# Patient Record
Sex: Female | Born: 1960 | Race: Black or African American | Hispanic: No | Smoking: Former smoker
Health system: Southern US, Community
[De-identification: ages and names within clinical notes are randomized; demographics above are authoritative.]

## PROBLEM LIST (undated history)

## (undated) DIAGNOSIS — M7712 Lateral epicondylitis, left elbow: Secondary | ICD-10-CM

## (undated) DIAGNOSIS — G4730 Sleep apnea, unspecified: Secondary | ICD-10-CM

## (undated) DIAGNOSIS — R011 Cardiac murmur, unspecified: Secondary | ICD-10-CM

## (undated) DIAGNOSIS — I10 Essential (primary) hypertension: Secondary | ICD-10-CM

## (undated) DIAGNOSIS — D649 Anemia, unspecified: Secondary | ICD-10-CM

## (undated) DIAGNOSIS — M797 Fibromyalgia: Secondary | ICD-10-CM

## (undated) DIAGNOSIS — M7711 Lateral epicondylitis, right elbow: Secondary | ICD-10-CM

## (undated) DIAGNOSIS — G5602 Carpal tunnel syndrome, left upper limb: Secondary | ICD-10-CM

## (undated) DIAGNOSIS — E049 Nontoxic goiter, unspecified: Secondary | ICD-10-CM

## (undated) HISTORY — PX: LAPAROSCOPY: SHX197

## (undated) HISTORY — PX: BREAST CYST ASPIRATION: SHX578

## (undated) HISTORY — PX: CARPAL TUNNEL RELEASE: SHX101

---

## 2005-01-26 ENCOUNTER — Ambulatory Visit: Payer: Self-pay | Admitting: Unknown Physician Specialty

## 2005-06-20 ENCOUNTER — Ambulatory Visit: Payer: Self-pay | Admitting: Internal Medicine

## 2005-06-29 ENCOUNTER — Ambulatory Visit: Payer: Self-pay | Admitting: Gastroenterology

## 2005-07-04 ENCOUNTER — Ambulatory Visit: Payer: Self-pay | Admitting: Internal Medicine

## 2005-08-04 ENCOUNTER — Ambulatory Visit: Payer: Self-pay | Admitting: Internal Medicine

## 2005-09-01 ENCOUNTER — Ambulatory Visit: Payer: Self-pay | Admitting: Internal Medicine

## 2005-10-02 ENCOUNTER — Ambulatory Visit: Payer: Self-pay | Admitting: Internal Medicine

## 2005-11-01 ENCOUNTER — Ambulatory Visit: Payer: Self-pay | Admitting: Internal Medicine

## 2005-12-02 ENCOUNTER — Ambulatory Visit: Payer: Self-pay | Admitting: Internal Medicine

## 2006-01-01 ENCOUNTER — Ambulatory Visit: Payer: Self-pay | Admitting: Internal Medicine

## 2006-02-01 ENCOUNTER — Ambulatory Visit: Payer: Self-pay | Admitting: Internal Medicine

## 2006-03-04 ENCOUNTER — Ambulatory Visit: Payer: Self-pay | Admitting: Internal Medicine

## 2006-07-04 HISTORY — PX: GASTRIC BYPASS: SHX52

## 2006-07-04 HISTORY — PX: CHOLECYSTECTOMY: SHX55

## 2006-12-12 ENCOUNTER — Ambulatory Visit: Payer: Self-pay | Admitting: Internal Medicine

## 2006-12-13 ENCOUNTER — Ambulatory Visit: Payer: Self-pay | Admitting: Gastroenterology

## 2008-09-25 ENCOUNTER — Encounter: Payer: Self-pay | Admitting: Maternal and Fetal Medicine

## 2008-10-16 ENCOUNTER — Encounter: Payer: Self-pay | Admitting: Maternal & Fetal Medicine

## 2009-02-19 ENCOUNTER — Observation Stay: Payer: Self-pay | Admitting: Obstetrics and Gynecology

## 2009-03-17 ENCOUNTER — Inpatient Hospital Stay: Payer: Self-pay | Admitting: Obstetrics & Gynecology

## 2009-10-12 ENCOUNTER — Encounter: Payer: Self-pay | Admitting: Rheumatology

## 2009-11-01 ENCOUNTER — Encounter: Payer: Self-pay | Admitting: Rheumatology

## 2010-09-07 ENCOUNTER — Ambulatory Visit: Payer: Self-pay | Admitting: Emergency Medicine

## 2011-09-07 ENCOUNTER — Ambulatory Visit: Payer: Self-pay | Admitting: Internal Medicine

## 2012-09-21 ENCOUNTER — Ambulatory Visit: Payer: Self-pay | Admitting: Obstetrics and Gynecology

## 2013-11-28 ENCOUNTER — Ambulatory Visit: Payer: Self-pay | Admitting: Internal Medicine

## 2014-12-17 ENCOUNTER — Other Ambulatory Visit: Payer: Self-pay | Admitting: Internal Medicine

## 2014-12-17 DIAGNOSIS — Z1231 Encounter for screening mammogram for malignant neoplasm of breast: Secondary | ICD-10-CM

## 2015-01-01 ENCOUNTER — Ambulatory Visit
Admission: RE | Admit: 2015-01-01 | Discharge: 2015-01-01 | Disposition: A | Discharge status: Home / Self Care | Payer: 59 | Source: Ambulatory Visit | Attending: Internal Medicine | Admitting: Internal Medicine

## 2015-01-01 DIAGNOSIS — Z1231 Encounter for screening mammogram for malignant neoplasm of breast: Secondary | ICD-10-CM | POA: Diagnosis present

## 2015-03-19 DIAGNOSIS — M7710 Lateral epicondylitis, unspecified elbow: Secondary | ICD-10-CM | POA: Insufficient documentation

## 2015-03-24 ENCOUNTER — Ambulatory Visit: Payer: Worker's Compensation | Attending: Surgery | Admitting: Occupational Therapy

## 2015-03-24 DIAGNOSIS — M79631 Pain in right forearm: Secondary | ICD-10-CM | POA: Diagnosis present

## 2015-03-24 DIAGNOSIS — M6281 Muscle weakness (generalized): Secondary | ICD-10-CM

## 2015-03-24 DIAGNOSIS — M25621 Stiffness of right elbow, not elsewhere classified: Secondary | ICD-10-CM

## 2015-03-24 NOTE — Therapy (Signed)
[facility]INE [address] [place], [address] Phone: [phone]   Fax:  [phone]  Occupational Therapy Treatment  Patient Details  Name: Angelica Wyatt MRN: [id number] Date of Birth: 10-23-1960 Referring Provider:  Christena Flake, MD  Encounter Date: 03/24/2015      OT End of Session - 03/24/15 2127    Visit Number 1   Number of Visits 8   Date for OT Re-Evaluation 04/21/15   OT Start Time 1026   OT Stop Time 1121   OT Time Calculation (min) 55 min   Activity Tolerance Patient tolerated treatment well;Patient limited by pain   Behavior During Therapy [facility] for tasks assessed/performed      No past medical history on file.  Past Surgical History  Procedure Laterality Date  . Breast cyst aspiration Left     neg    There were no vitals filed for this visit.  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 Yes   Pain Score 5    Pain Location Elbow   Pain Orientation Right   Pain Descriptors / Indicators Aching   Pain Onset 1 to 4 weeks ago   Pain Frequency Constant            [facility] OT Assessment - 03/24/15 0001    Assessment   Diagnosis R lateral epicondylitis    Onset Date 02/24/15   Assessment Pt present with pain at R elbow about 4 wks ago - she did get married end of July and moved - so packed some and do more at school with diaper changes and liffting - pt seen  about week ago by Dr Joice Lofts and had a shot - and put in sling - she did not get counter force brace yet - refer to hand therapy/OT    Home  Environment   Lives With Family   Prior Function   Level  of Independence Independent   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 - need to change diaper , lifting - playing     AROM   Right Elbow Flexion 140   Right Elbow Extension -25   Left Elbow Flexion 145   Left Elbow Extension 0   Right Wrist Extension 50 Degrees   Right Wrist Flexion 80 Degrees   Left Wrist Extension 66 Degrees   Left Wrist Flexion 82 Degrees   Strength   Right Hand Grip (lbs) 26   Left Hand Grip (lbs) 45                  OT Treatments/Exercises (OP) - 03/24/15 0001    Elbow Exercises   Other elbow exercises AROM pain free range for flexion and extention    Other elbow exercises Cervical neck latera l flexion to R and schalens stretch to the R after warm shower - slling pulling on neck some what - see HEP                 OT Education -  03/24/15 1431    Education provided Yes   Education Details HEP   Person(s) Educated Patient   Methods Explanation;Demonstration;Tactile cues;Verbal cues;Handout   Comprehension Verbal cues required;Returned demonstration;Verbalized understanding          OT Short Term Goals - 03/24/15 2138    OT SHORT TERM GOAL #1   Title Pain on PREE decrease by at least 15 points   Baseline pain on PREE 36/50   Time 3   Period Weeks   Status New   OT SHORT TERM GOAL #2   Title Pt to verbalize 3 modifications on adapting the way she do her job, tasks at home and ADL's/IADL's   Baseline no knowledge how to adapt   Time 2   Period Weeks   Status New           OT Long Term Goals - 03/24/15 2140    OT LONG TERM GOAL #1   Title AROM at elbow and wrist improve to WNL to use R UE in all ADL's pain free   Baseline Pain 7/10 at worse, no using R hand - mostly L hand    Time 4   Period Weeks   Status New   OT LONG TERM GOAL #2   Title Funcion on PREE improve with at least 15 points   Baseline Function score on PREE 36/50   Time 4   Period Weeks   Status New               Plan -  03/24/15 2129    Clinical Impression Statement Pt present with pain at R elbow and over distal bicep - reporting started about 4 wks ago - she is doing more pick up and diaper changes the last 4-6 wks and she got married 6-7 wks ago and moved - she present with R arm in sling - and some stiffness starting in elbow ROM , decrease wrist extention with pain  in extention , decrease grip strength - and also pain and  R upper trap/scalens in spasm - pt was fitted with wrist splint -  and reinforce to get counter force  forearm splint - she was ace bandaging  over extensor mass - pt can benefit from OT services to decrease pain and  increaes ROM /strength   Pt will benefit from skilled therapeutic intervention in order to improve on the following deficits (Retired) Impaired UE functional use;Increased muscle spasms;Decreased strength;Decreased range of motion;Impaired flexibility;Pain   Rehab Potential Good   OT Frequency 2x / week   OT Duration 4 weeks   OT Treatment/Interventions Self-care/ADL training;Moist Heat;Cryotherapy;Therapeutic exercise;Manual Therapy;Splinting;Patient/family education;Other (comment)   Plan assess HEP , Pain and if got counterforce splint    OT Home Exercise Plan see pt instruction   Consulted and Agree with Plan of Care Patient        Problem List There are no active problems to display for this patient.   Oletta Cohn OTR/L,CLT 03/24/2015, 9:51 PM  [place] [facility] PHYSICAL AND SPORTS MEDICINE [address], [place], [address] Phone: [phone]   Fax:  [phone]

## 2015-03-24 NOTE — Patient Instructions (Signed)
Pt to do heat, cross friction massage lat epicondyle , elbow flexion and extention  AROM  Ice at end   Also do some lateral cervical stretch for upper trap - scalens too - tight from wearing sling   Fitted with wrist extention splint to wear all the time - off with HEP - and maybe when sitting not wear sling and decrease 1-2 hrs day out of sling

## 2015-03-26 ENCOUNTER — Ambulatory Visit: Payer: Worker's Compensation | Admitting: Occupational Therapy

## 2015-03-26 DIAGNOSIS — M25621 Stiffness of right elbow, not elsewhere classified: Secondary | ICD-10-CM

## 2015-03-26 DIAGNOSIS — M79631 Pain in right forearm: Secondary | ICD-10-CM

## 2015-03-26 DIAGNOSIS — M6281 Muscle weakness (generalized): Secondary | ICD-10-CM

## 2015-03-26 NOTE — Therapy (Signed)
[place] [facility]TS [facility]INE [address], [place], [address] Phone: [phone]   Fax:  [phone]  Occupational Therapy Treatment  Patient Details  Name: STEFANIE HODGENS MRN: [id number] Date of Birth: 1960-10-22 Referring Provider:  Christena Flake, MD  Encounter Date: 03/26/2015      OT End of Session - 03/26/15 1502    Visit Number 2   Number of Visits 8   Date for OT Re-Evaluation 04/21/15   OT Start Time 0921   OT Stop Time 1015   OT Time Calculation (min) 54 min   Activity Tolerance Patient tolerated treatment well;Patient limited by pain   Behavior During Therapy [facility] for tasks assessed/performed      No past medical history on file.  Past Surgical History  Procedure Laterality Date  . Breast cyst aspiration Left     neg    There were no vitals filed for this visit.  Visit Diagnosis:  Pain of right forearm  Stiffness of elbow joint, right  Muscle weakness      Subjective Assessment - 03/26/15 1456    Subjective  Pain is little better than last time -  I did not wear the sling and I did buy strap for my forearm but you need to show me how to put it on    Patient Stated Goals Wants to be pain free and the use of my R arm back    Currently in Pain? Yes   Pain Score 4    Pain Location Elbow   Pain Orientation Right   Pain Descriptors / Indicators Aching   Pain Onset 1 to 4 weeks ago        Moist heat to elbow and then on distal upper arm - 10 min - and on R upper traps at the same time  For trigger points - spasm from wearing sling   Gentle PROM flexion of wrist in pronation and neutral - no pain  AROM of elbow flexion and extnetion on wall - focus on end range extention - guarding still from carrying elbow in flexion the last few wks  Needed min A and t/c for end range   Ed on  Donning and wearing of counter force strap on forearm - after donning - pt report decrease pain with grip compare to  prior to donning  Cont wearing wrist extnetion splint   [place] at 3.87mhz, 20% and 0.8 intensity  - 5 in over upper trap R tirgger point static prior to manual  Soft tissue mobs on trigger points prior to cervical  Stretch   Lat flexion and Scalenus stretch for cervical - to decrease pain upper trap on R  Reviewed adn needed min A                         OT Short Term Goals - 03/24/15 2138    OT SHORT TERM GOAL #1   Title Pain on PREE decrease by at least 15 points   Baseline pain on PREE 36/50   Time 3   Period Weeks   Status New   OT SHORT TERM GOAL #2   Title Pt to verbalize 3 modifications on adapting the way she do her job, tasks at home and ADL's/IADL's   Baseline no knowledge how to adapt   Time 2   Period Weeks   Status New  OT Long Term Goals - 03/24/15 2140    OT LONG TERM GOAL #1   Title AROM at elbow and wrist improve to WNL to use R UE in all ADL's pain free   Baseline Pain 7/10 at worse, no using R hand - mostly L hand    Time 4   Period Weeks   Status New   OT LONG TERM GOAL #2   Title Funcion on PREE improve with at least 15 points   Baseline Function score on PREE 36/50   Time 4   Period Weeks   Status New               Plan - 03/26/15 1503    Clinical Impression Statement Pain decreaes this date but still increased at elbow , extensor mass and bicep - pt fitted with counterforce strap and wrsit splint to cont with - no lifting with palm down , squeezing - cont with HEP add  - elbow AROM against wall wit hfoucs on elbow extnetion - cont wit decreaseing pain     Pt will benefit from skilled therapeutic intervention in order to improve on the following deficits (Retired) Impaired UE functional use;Increased muscle spasms;Decreased strength;Decreased range of motion;Impaired flexibility;Pain   Rehab Potential Good   OT Frequency 2x / week   OT Duration 4 weeks   OT Treatment/Interventions Self-care/ADL training;Moist  Heat;Cryotherapy;Therapeutic exercise;Manual Therapy;Splinting;Patient/family education;Other (comment)   Plan assess pain, HEP and if can add wrist flexoin    OT Home Exercise Plan see pt instruction   Consulted and Agree with Plan of Care Patient        Problem List There are no active problems to display for this patient.   Oletta Cohn  OTR/L,CLT  03/26/2015, 3:07 PM  [place] [facility] PHYSICAL AND SPORTS MEDICINE [address], [place], [address] Phone: [phone]   Fax:  [phone]

## 2015-03-26 NOTE — Patient Instructions (Addendum)
Same HEP , add elbow against wall and focus on end range elbow extnetion   Counter force brace when using arm

## 2015-03-31 ENCOUNTER — Ambulatory Visit: Payer: Worker's Compensation | Admitting: Occupational Therapy

## 2015-03-31 DIAGNOSIS — M79631 Pain in right forearm: Secondary | ICD-10-CM

## 2015-03-31 DIAGNOSIS — M25621 Stiffness of right elbow, not elsewhere classified: Secondary | ICD-10-CM

## 2015-03-31 DIAGNOSIS — M6281 Muscle weakness (generalized): Secondary | ICD-10-CM

## 2015-03-31 NOTE — Patient Instructions (Addendum)
Cont with moist heat  Forearm and distall upper arm - as well as L upper traps 10 min at [facility] to decrease pain and increase flexibility  Forearm extensor stretch with elbow to side - 1 0reps hold 5 sec - in neutral and pronation  Extended arm and same - but needed support from other hand under arm   Shoulder ABD and flexion 10 reps  Ext rotation 10 reps - v/c no wrist extention at end rage   Sup/pro with elbow to side  RD and UD in neutal position and elbow to side  10 reps   Ice at end over lateral epicondyle   Reviewed again wearing of counter force brace and wrist splint - but off when at rest or light act like reading or eating  On with using R hand

## 2015-03-31 NOTE — Therapy (Signed)
[facility] PHYS[facility]ORTS MEDICINE [address], [place], [address] Phone: [phone]   Fax:  [phone]  Occupational Therapy Treatment  Patient Details  Name: Angelica Wyatt MRN: [id number] Date of Birth: 20-Jul-1960 Referring Provider:  Christena Flake, MD  Encounter Date: 03/31/2015      OT End of Session - 03/31/15 1018    Visit Number 3   Number of Visits 8   Date for OT Re-Evaluation 04/21/15   OT Start Time 0905   OT Stop Time 1001   OT Time Calculation (min) 56 min   Activity Tolerance Patient tolerated treatment well;Patient limited by pain   Behavior During Therapy [facility] for tasks assessed/performed      No past medical history on file.  Past Surgical History  Procedure Laterality Date  . Breast cyst aspiration Left     neg    There were no vitals filed for this visit.  Visit Diagnosis:  Pain of right forearm  Stiffness of elbow joint, right  Muscle weakness      Subjective Assessment - 03/31/15 1010    Subjective  Pain at my elbow and bicep better - my forearm under strap just feels like sore, more with the rainy weather - like a bruise - but the pain is better with the strap on when using then without it    Patient Stated Goals Wants to be pain free and the use of my R arm back    Currently in Pain? Yes   Pain Score 4    Pain Location Arm   Pain Orientation Right      Moist heating pad to R forearm and distal upper arm - as well as L upper traps 10 min at [facility] to decrease pain and increase flexibility  Forearm extensor stretch with elbow to side - 1 0reps hold 5 sec - in neutral and pronation  Extended arm and same - but needed support from other hand under arm   Shoulder ABD and flexion 10 reps  Needed t/c and mod v/c for compensation of scapula and shoulder elevation - some discomfort again at R upper traps  Ext rotation 10 reps - v/c no wrist extention at end rage   Sup/pro with elbow to side   RD and UD in neutal position and elbow to side  10 reps  DID some soft tissue mobs to extensor mass after heat prior to ROM    Ice at end over lateral epicondyle  Pt report she feels better than coming in - still some discomfort over extensor mass  Cont with counter force brace and wrist splint - but off when at rest or light act like reading or eating  On with using R hand                            OT Education - 03/31/15 1018    Education provided Yes   Education Details HEP   Person(s) Educated Patient   Methods Explanation;Demonstration;Tactile cues;Verbal cues;Handout   Comprehension Verbal cues required;Returned demonstration;Verbalized understanding          OT Short Term Goals - 03/31/15 1021    OT SHORT TERM GOAL #1   Title Pain on PREE decrease by at least 15 points   Baseline pain on PREE 36/50   Time 3   Period Weeks   Status On-going   OT SHORT TERM GOAL #2  Title Pt to verbalize 3 modifications on adapting the way she do her job, tasks at home and ADL's/IADL's   Baseline verbalize some and using palm up as assist   Time 2   Period Weeks   Status On-going           OT Long Term Goals - 03/31/15 1021    OT LONG TERM GOAL #1   Title AROM at elbow and wrist improve to WNL to use R UE in all ADL's pain free   Baseline Pain 7/10 at worse, no using R hand - mostly L hand    Time 4   Period Weeks   Status On-going   OT LONG TERM GOAL #2   Title Funcion on PREE improve with at least 15 points   Baseline Function score on PREE 36/50   Time 4   Period Weeks   Status On-going               Plan - 03/31/15 1019    Clinical Impression Statement Pain cont to improve - and this date not at lateral epicondyle - able to tolerate some stretches to forearm and shoulder , wrist AROM exercises - pt do compensate with elevation of shoulders with shoulder ABD and flexion - do have trouble with some  upper traps spasm - cont to decrease  pain and increase strenght, ROM     Pt will benefit from skilled therapeutic intervention in order to improve on the following deficits (Retired) Impaired UE functional use;Increased muscle spasms;Decreased strength;Decreased range of motion;Impaired flexibility;Pain   Rehab Potential Good   OT Frequency 2x / week   OT Duration 4 weeks   OT Treatment/Interventions Self-care/ADL training;Moist Heat;Cryotherapy;Therapeutic exercise;Manual Therapy;Splinting;Patient/family education;Other (comment)   Plan Assess pain , HEP and upgrade to tolerance   OT Home Exercise Plan see pt instruction   Consulted and Agree with Plan of Care Patient        Problem List There are no active problems to display for this patient.   Oletta Cohn OTR/L,CLT 03/31/2015, 10:23 AM  [place] [facility] PHYSICAL AND SPORTS MEDICINE [address], [place], [address] Phone: [phone]   Fax:  [phone]

## 2015-04-02 ENCOUNTER — Ambulatory Visit: Payer: Worker's Compensation | Admitting: Occupational Therapy

## 2015-04-02 DIAGNOSIS — M79631 Pain in right forearm: Secondary | ICD-10-CM

## 2015-04-02 DIAGNOSIS — M25621 Stiffness of right elbow, not elsewhere classified: Secondary | ICD-10-CM

## 2015-04-02 DIAGNOSIS — M6281 Muscle weakness (generalized): Secondary | ICD-10-CM

## 2015-04-02 NOTE — Patient Instructions (Addendum)
COntrast 3 min heat/ 1 min ice  X 2 and 3 min heat to do at home for pain   Same HEP for stretches and AROM    Cont with counter force brace and wrist splint - but off when at rest or light act like reading or eating  On with using R hand

## 2015-04-02 NOTE — Therapy (Signed)
[place] [facility] PHYSICAL AND SPORTS MEDICINE [address], [place], [address] Phone: [phone]   Fax:  [phone]  Occupational Therapy Treatment  Patient Details  Name: Angelica Wyatt MRN: [id number] Date of Birth: 03/07/1961 Referring Provider:  Christena Flake, MD  Encounter Date: 04/02/2015      OT End of Session - 04/02/15 0955    Visit Number 4   Number of Visits 8   Date for OT Re-Evaluation 04/21/15   OT Start Time 0909   OT Stop Time 0954   OT Time Calculation (min) 45 min   Activity Tolerance Patient tolerated treatment well   Behavior During Therapy [facility] for tasks assessed/performed      No past medical history on file.  Past Surgical History  Procedure Laterality Date  . Breast cyst aspiration Left     neg    There were no vitals filed for this visit.  Visit Diagnosis:  Pain of right forearm  Stiffness of elbow joint, right  Muscle weakness      Subjective Assessment - 04/02/15 0912    Subjective  My arm just ache at night time and when it rains or cold weather - I am awake since 3 am - feels still swollen in the forearm and pain in the forearm -    Patient Stated Goals Wants to be pain free and the use of my R arm back    Pain Score 6    Pain Location Arm   Pain Descriptors / Indicators Aching   Pain Onset More than a month ago   Pain Frequency Constant                              OT Education - 04/02/15 0955    Education provided Yes   Education Details HEP   Person(s) Educated Patient   Methods Explanation;Tactile cues;Demonstration;Verbal cues   Comprehension Verbal cues required;Returned demonstration;Verbalized understanding          OT Short Term Goals - 04/02/15 0958    OT SHORT TERM GOAL #1   Title Pain on PREE decrease by at least 15 points   Baseline pain on PREE 36/50 at eval    Time 3   Period Weeks   Status On-going   OT SHORT TERM GOAL #2   Title Pt to  verbalize 3 modifications on adapting the way she do her job, tasks at home and ADL's/IADL's   Baseline verbalize some and using palm up as assist   Time 2   Period Weeks   Status On-going           OT Long Term Goals - 04/02/15 6962    OT LONG TERM GOAL #1   Title AROM at elbow and wrist improve to WNL to use R UE in all ADL's pain free   Baseline Pain 7/10 at worse, no using R hand - mostly L hand    Time 4   Period Weeks   Status On-going   OT LONG TERM GOAL #2   Title Funcion on PREE improve with at least 15 points   Baseline Function score on PREE 36/50   Time 4   Period Weeks   Status On-going               Plan - 04/02/15 0955    Clinical Impression Statement Pt report achiness over extensor mass with the damp weather,  or when close to coldness in store, no pain or tenderness at lateral epicondyle - able to do HEP, AROM to shoulder , elbow with no increase pain - pt report  that she is approve from workers comp but they are checking if DR Poggi is in system and will let me know if she is continueing therapy here next week    Pt will benefit from skilled therapeutic intervention in order to improve on the following deficits (Retired) Impaired UE functional use;Increased muscle spasms;Decreased strength;Decreased range of motion;Impaired flexibility;Pain   Rehab Potential Good   OT Frequency 2x / week   OT Duration 4 weeks   OT Treatment/Interventions Self-care/ADL training;Moist Heat;Cryotherapy;Therapeutic exercise;Manual Therapy;Splinting;Patient/family education;Other (comment)   Plan asess pain , performance of HEP    OT Home Exercise Plan see pt instruction   Consulted and Agree with Plan of Care Patient        Problem List There are no active problems to display for this patient.   Oletta Cohn OTR/L,CLT 04/02/2015, 10:04 AM  [place] [facility] PHYSICAL AND SPORTS MEDICINE [address], [place],  [address] Phone: [phone]   Fax:  [phone]

## 2015-04-07 ENCOUNTER — Ambulatory Visit: Payer: Worker's Compensation | Admitting: Occupational Therapy

## 2015-04-09 ENCOUNTER — Ambulatory Visit: Payer: Worker's Compensation | Admitting: Occupational Therapy

## 2015-07-08 DIAGNOSIS — G5602 Carpal tunnel syndrome, left upper limb: Secondary | ICD-10-CM

## 2015-07-08 HISTORY — DX: Carpal tunnel syndrome, left upper limb: G56.02

## 2015-11-19 ENCOUNTER — Other Ambulatory Visit: Payer: Self-pay | Admitting: Internal Medicine

## 2015-11-19 DIAGNOSIS — Z1231 Encounter for screening mammogram for malignant neoplasm of breast: Secondary | ICD-10-CM

## 2016-01-07 ENCOUNTER — Ambulatory Visit: Payer: 59

## 2016-01-11 ENCOUNTER — Ambulatory Visit: Payer: 59

## 2016-01-11 ENCOUNTER — Ambulatory Visit
Admission: RE | Admit: 2016-01-11 | Discharge: 2016-01-11 | Disposition: A | Discharge status: Home / Self Care | Payer: 59 | Source: Ambulatory Visit | Attending: Internal Medicine | Admitting: Internal Medicine

## 2016-01-11 DIAGNOSIS — Z1231 Encounter for screening mammogram for malignant neoplasm of breast: Secondary | ICD-10-CM | POA: Diagnosis present

## 2016-03-17 ENCOUNTER — Encounter: Payer: Self-pay | Admitting: *Deleted

## 2016-03-21 NOTE — Discharge Instructions (Signed)
[place] [facility] °[facility] ° °POST OPERATIVE INSTRUCTIONS FOR DR. TROXLER AND DR. FOWLER °[facility] ° ° °1. Take your medication as prescribed.  Pain medication should be taken only as needed. ° °2. Keep the dressing clean, dry and intact. ° °3. Keep your foot elevated above the heart level for the first 48 hours. ° °4. Walking to the bathroom and brief periods of walking are acceptable, unless we have instructed you to be non-weight bearing. ° °5. Always wear your post-op shoe when walking.  Always use your crutches if you are to be non-weight bearing. ° °6. Do not take a shower. Baths are permissible as long as the foot is kept out of the water.  ° °7. Every hour you are awake:  °- Bend your knee 15 times. °- Flex foot 15 times °- Massage calf 15 times ° °8. Call [facility] ([phone]) if any of the following problems occur: °- You develop a temperature or fever. °- The bandage becomes saturated with blood. °- Medication does not stop your pain. °- Injury of the foot occurs. °- Any symptoms of infection including redness, odor, or red streaks running from wound. ° °General Anesthesia, Adult, Care After °Refer to this sheet in the next few weeks. These instructions provide you with information on caring for yourself after your procedure. Your health care provider may also give you more specific instructions. Your treatment has been planned according to current medical practices, but problems sometimes occur. Call your health care provider if you have any problems or questions after your procedure. °WHAT TO EXPECT AFTER THE PROCEDURE °After the procedure, it is typical to experience: °· Sleepiness. °· Nausea and vomiting. °HOME CARE INSTRUCTIONS °· For the first 24 hours after general anesthesia: °¨ Have a responsible person with you. °¨ Do not drive a car. If you are alone, do not take public transportation. °¨ Do not drink alcohol. °¨ Do not take  medicine that has not been prescribed by your health care provider. °¨ Do not sign important papers or make important decisions. °¨ You may resume a normal diet and activities as directed by your health care provider. °· Change bandages (dressings) as directed. °· If you have questions or problems that seem related to general anesthesia, call the hospital and ask for the anesthetist or anesthesiologist on call. °SEEK MEDICAL CARE IF: °· You have nausea and vomiting that continue the day after anesthesia. °· You develop a rash. °SEEK IMMEDIATE MEDICAL CARE IF:  °· You have difficulty breathing. °· You have chest pain. °· You have any allergic problems. °  °This information is not intended to replace advice given to you by your health care provider. Make sure you discuss any questions you have with your health care provider. °  °Document Released: 09/26/2000 Document Revised: 07/11/2014 Document Reviewed: 10/19/2011 °Elsevier Interactive Patient Education ©2016 [employer] ° °

## 2016-03-23 ENCOUNTER — Ambulatory Visit: Payer: 59 | Admitting: Student in an Organized Health Care Education/Training Program

## 2016-03-23 ENCOUNTER — Ambulatory Visit
Admission: RE | Admit: 2016-03-23 | Discharge: 2016-03-23 | Disposition: A | Discharge status: Home / Self Care | Payer: 59 | Source: Ambulatory Visit | Attending: Podiatry | Admitting: Podiatry

## 2016-03-23 ENCOUNTER — Encounter
Admission: RE | Disposition: A | Discharge status: Home / Self Care | Payer: Self-pay | Source: Ambulatory Visit | Attending: Podiatry

## 2016-03-23 DIAGNOSIS — Z91013 Allergy to seafood: Secondary | ICD-10-CM | POA: Diagnosis not present

## 2016-03-23 DIAGNOSIS — I10 Essential (primary) hypertension: Secondary | ICD-10-CM | POA: Diagnosis not present

## 2016-03-23 DIAGNOSIS — Z91040 Latex allergy status: Secondary | ICD-10-CM | POA: Diagnosis not present

## 2016-03-23 DIAGNOSIS — G4730 Sleep apnea, unspecified: Secondary | ICD-10-CM | POA: Insufficient documentation

## 2016-03-23 DIAGNOSIS — D649 Anemia, unspecified: Secondary | ICD-10-CM | POA: Diagnosis not present

## 2016-03-23 DIAGNOSIS — M2022 Hallux rigidus, left foot: Secondary | ICD-10-CM | POA: Insufficient documentation

## 2016-03-23 DIAGNOSIS — Z87891 Personal history of nicotine dependence: Secondary | ICD-10-CM | POA: Insufficient documentation

## 2016-03-23 DIAGNOSIS — G709 Myoneural disorder, unspecified: Secondary | ICD-10-CM | POA: Diagnosis not present

## 2016-03-23 DIAGNOSIS — M2012 Hallux valgus (acquired), left foot: Secondary | ICD-10-CM | POA: Diagnosis not present

## 2016-03-23 HISTORY — DX: Cardiac murmur, unspecified: R01.1

## 2016-03-23 HISTORY — DX: Carpal tunnel syndrome, left upper limb: G56.02

## 2016-03-23 HISTORY — DX: Anemia, unspecified: D64.9

## 2016-03-23 HISTORY — DX: Sleep apnea, unspecified: G47.30

## 2016-03-23 HISTORY — DX: Lateral epicondylitis, right elbow: M77.11

## 2016-03-23 HISTORY — DX: Lateral epicondylitis, right elbow: M77.12

## 2016-03-23 HISTORY — PX: CHEILECTOMY: SHX1336

## 2016-03-23 HISTORY — PX: AIKEN OSTEOTOMY: SHX6331

## 2016-03-23 HISTORY — DX: Fibromyalgia: M79.7

## 2016-03-23 HISTORY — DX: Essential (primary) hypertension: I10

## 2016-03-23 HISTORY — DX: Nontoxic goiter, unspecified: E04.9

## 2016-03-23 SURGERY — CHEILECTOMY
Anesthesia: Regional | Site: Foot | Laterality: Left | Wound class: Clean

## 2016-03-23 MED ORDER — FENTANYL CITRATE (PF) 100 MCG/2ML IJ SOLN: 25.0000 ug | INTRAMUSCULAR | Status: DC | PRN

## 2016-03-23 MED ORDER — FENTANYL CITRATE (PF) 100 MCG/2ML IJ SOLN
INTRAMUSCULAR | Status: DC | PRN
  Administered 2016-03-23: 100 ug via INTRAVENOUS

## 2016-03-23 MED ORDER — MIDAZOLAM HCL 2 MG/2ML IJ SOLN
INTRAMUSCULAR | Status: DC | PRN
  Administered 2016-03-23: 2 mg via INTRAVENOUS

## 2016-03-23 MED ORDER — GLYCOPYRROLATE 0.2 MG/ML IJ SOLN
INTRAMUSCULAR | Status: DC | PRN
Start: 2016-03-23 — End: 2016-03-23
  Administered 2016-03-23: 0.2 mg via INTRAVENOUS

## 2016-03-23 MED ORDER — OXYCODONE HCL 5 MG PO TABS: 5.0000 mg | ORAL_TABLET | Freq: Once | ORAL | Status: DC | PRN

## 2016-03-23 MED ORDER — MEPERIDINE HCL 25 MG/ML IJ SOLN
6.2500 mg | INTRAMUSCULAR | Status: DC | PRN
Start: 2016-03-23 — End: 2016-03-23

## 2016-03-23 MED ORDER — EPHEDRINE SULFATE 50 MG/ML IJ SOLN
INTRAMUSCULAR | Status: DC | PRN
  Administered 2016-03-23: 10 mg via INTRAVENOUS
  Administered 2016-03-23: 15 mg via INTRAVENOUS

## 2016-03-23 MED ORDER — LACTATED RINGERS IV SOLN
INTRAVENOUS | Status: DC
  Administered 2016-03-23: 11:00:00 via INTRAVENOUS

## 2016-03-23 MED ORDER — ROPIVACAINE HCL 5 MG/ML IJ SOLN
INTRAMUSCULAR | Status: DC | PRN
  Administered 2016-03-23: 300 mg via EPIDURAL

## 2016-03-23 MED ORDER — BUPIVACAINE HCL 0.25 % IJ SOLN
INTRAMUSCULAR | Status: DC | PRN
  Administered 2016-03-23: 10 mL

## 2016-03-23 MED ORDER — OXYCODONE-ACETAMINOPHEN 5-325 MG PO TABS: 1.0000 | ORAL_TABLET | ORAL | 0 refills | Status: DC | PRN

## 2016-03-23 MED ORDER — ONDANSETRON HCL 4 MG/2ML IJ SOLN
INTRAMUSCULAR | Status: DC | PRN
  Administered 2016-03-23: 4 mg via INTRAVENOUS

## 2016-03-23 MED ORDER — OXYCODONE HCL 5 MG/5ML PO SOLN: 5.0000 mg | Freq: Once | ORAL | Status: DC | PRN

## 2016-03-23 MED ORDER — DEXAMETHASONE SODIUM PHOSPHATE 4 MG/ML IJ SOLN
INTRAMUSCULAR | Status: DC | PRN
  Administered 2016-03-23: 4 mg via INTRAVENOUS
  Administered 2016-03-23: 8 mg via INTRAVENOUS

## 2016-03-23 MED ORDER — LIDOCAINE HCL (CARDIAC) 20 MG/ML IV SOLN
INTRAVENOUS | Status: DC | PRN
  Administered 2016-03-23: 100 mg via INTRATRACHEAL

## 2016-03-23 MED ORDER — PROPOFOL 10 MG/ML IV BOLUS
INTRAVENOUS | Status: DC | PRN
  Administered 2016-03-23: 200 mg via INTRAVENOUS

## 2016-03-23 MED ORDER — ONDANSETRON HCL 4 MG/2ML IJ SOLN: 4.0000 mg | Freq: Once | INTRAMUSCULAR | Status: DC | PRN

## 2016-03-23 MED ORDER — CEFAZOLIN SODIUM-DEXTROSE 2-4 GM/100ML-% IV SOLN
2.0000 g | Freq: Once | INTRAVENOUS | Status: AC
  Administered 2016-03-23: 2 g via INTRAVENOUS

## 2016-03-23 SURGICAL SUPPLY — 45 items
BANDAGE ELASTIC 4 VELCRO NS (GAUZE/BANDAGES/DRESSINGS) ×2 IMPLANT
BENZOIN TINCTURE PRP APPL 2/3 (GAUZE/BANDAGES/DRESSINGS) ×2 IMPLANT
BLADE MED AGGRESSIVE (BLADE) IMPLANT
BLADE OSC/SAGITTAL MD 5.5X18 (BLADE) IMPLANT
BLADE OSC/SAGITTAL MD 9X18.5 (BLADE) ×2 IMPLANT
BLADE SURG 15 STRL LF DISP TIS (BLADE) ×1 IMPLANT
BLADE SURG 15 STRL SS (BLADE) ×1
BNDG ESMARK 4X12 TAN STRL LF (GAUZE/BANDAGES/DRESSINGS) ×2 IMPLANT
BNDG GAUZE 4.5X4.1 6PLY STRL (MISCELLANEOUS) ×2 IMPLANT
BNDG STRETCH 4X75 STRL LF (GAUZE/BANDAGES/DRESSINGS) ×2 IMPLANT
CANISTER SUCT 1200ML W/VALVE (MISCELLANEOUS) ×2 IMPLANT
COVER LIGHT HANDLE UNIVERSAL (MISCELLANEOUS) ×4 IMPLANT
CUFF TOURN SGL QUICK 18 (TOURNIQUET CUFF) ×2 IMPLANT
DRAPE FLUOR MINI C-ARM 54X84 (DRAPES) ×2 IMPLANT
DURAPREP 26ML APPLICATOR (WOUND CARE) ×2 IMPLANT
GAUZE PETRO XEROFOAM 1X8 (MISCELLANEOUS) ×2 IMPLANT
GAUZE SPONGE 4X4 12PLY STRL (GAUZE/BANDAGES/DRESSINGS) ×2 IMPLANT
GLOVE BIOGEL PI IND STRL 8 (GLOVE) ×2 IMPLANT
GLOVE BIOGEL PI INDICATOR 8 (GLOVE) ×2
GLOVE PI ULTRA LF STRL 7.5 (GLOVE) ×2 IMPLANT
GLOVE PI ULTRA NON LATEX 7.5 (GLOVE) ×2
GOWN STRL REUS W/ TWL LRG LVL3 (GOWN DISPOSABLE) ×2 IMPLANT
GOWN STRL REUS W/TWL LRG LVL3 (GOWN DISPOSABLE) ×2
K-WIRE DBL END TROCAR 6X.045 (WIRE) ×2
K-WIRE DBL END TROCAR 6X.062 (WIRE)
KIT ROOM TURNOVER OR (KITS) ×2 IMPLANT
KWIRE DBL END TROCAR 6X.045 (WIRE) ×1 IMPLANT
KWIRE DBL END TROCAR 6X.062 (WIRE) IMPLANT
NS IRRIG 500ML POUR BTL (IV SOLUTION) ×2 IMPLANT
PACK EXTREMITY ARMC (MISCELLANEOUS) ×2 IMPLANT
PAD GROUND ADULT SPLIT (MISCELLANEOUS) ×2 IMPLANT
PIN BALLS 3/8 F/.045 WIRE (MISCELLANEOUS) IMPLANT
RASP SM TEAR CROSS CUT (RASP) ×2 IMPLANT
STAPLE SPR MET 9X9X9 1.5 (Staple) ×2 IMPLANT
STOCKINETTE STRL 6IN 960660 (GAUZE/BANDAGES/DRESSINGS) ×2 IMPLANT
STRAP BODY AND KNEE 60X3 (MISCELLANEOUS) ×2 IMPLANT
STRIP CLOSURE SKIN 1/4X4 (GAUZE/BANDAGES/DRESSINGS) ×2 IMPLANT
SUT ETHILON 5-0 FS-2 18 BLK (SUTURE) IMPLANT
SUT MNCRL 5-0+ PC-1 (SUTURE) ×1 IMPLANT
SUT MONOCRYL 5-0 (SUTURE) ×1
SUT VIC AB 2-0 SH 27 (SUTURE)
SUT VIC AB 2-0 SH 27XBRD (SUTURE) IMPLANT
SUT VIC AB 3-0 SH 27 (SUTURE) ×1
SUT VIC AB 3-0 SH 27X BRD (SUTURE) ×1 IMPLANT
SUT VIC AB 4-0 FS2 27 (SUTURE) ×2 IMPLANT

## 2016-03-23 NOTE — Anesthesia Procedure Notes (Signed)
Anesthesia Regional Block:  Popliteal block  Pre-Anesthetic Checklist: ,, timeout performed, Correct Patient, Correct Site, Correct Laterality, Correct Procedure, Correct Position, site marked, Risks and benefits discussed,  Surgical consent,  Pre-op evaluation,  At surgeon's request and post-op pain management  Laterality: Left  Prep: chloraprep       Needles:  Injection technique: Single-shot  Needle Type: Echogenic Needle     Needle Length: 9cm 9 cm Needle Gauge: 21 and 21 G    Additional Needles:  Procedures: ultrasound guided (picture in chart) Popliteal block Narrative:  Start time: 03/23/2016 11:18 AM End time: 03/23/2016 11:20 AM Injection made incrementally with aspirations every 5 mL.  Performed by: Personally   Additional Notes: Functioning IV was confirmed and monitors applied. Ultrasound guidance: relevant anatomy identified, needle position confirmed, local anesthetic spread visualized around nerve(s)., vascular puncture avoided.  Image printed for medical record.  Negative aspiration and no paresthesias; incremental administration of local anesthetic. The patient tolerated the procedure well. Vitals signes recorded in RN notes.

## 2016-03-23 NOTE — Progress Notes (Signed)
Assisted Neel Thomas ANMD with left, popliteal/saphenous block. Side rails up, monitors on throughout procedure. See vital signs in flow sheet. Tolerated Procedure well.  

## 2016-03-23 NOTE — Op Note (Signed)
Operative note   Surgeon:Ameila Weldon Armed forces logistics/support/administrative officerowler    Assistant: None    Preop diagnosis:1. Left foot hallux valgus, 2. left foot hallux limitus    Postop diagnosis: Same    Procedure: 1. Modified McBride with cheilectomy  2. Akin osteotomy left proximal phalanx    EBL: Minimal    Anesthesia:local, regional and general    Hemostasis: Midcalf tourniquet inflated to 200mmhg for approximate 60 minutes    Specimen: Arthritic left great toe joint    Complications: None    Operative indications:Angelica Wyatt is an 55 y.o. that presents today for surgical intervention.  The risks/benefits/alternatives/complications have been discussed and consent has been given.    Procedure:  Patient was brought into the OR and placed on the operating table in thesupine position. After anesthesia was obtained theleft lower extremity was prepped and draped in usual sterile fashion.  Attention was directed to the dorsomedial left first MTPJ where an incision was made down the capsule. Next a longitudinal capsulotomy was performed. The head of metatarsal and base of the proximal phalanx were then exposed. The noted arthritic changes on the dorsal medial and lateral aspect of the MTPJ were excised with use of a Rongeur and power saw and power rasp. Approximately the dorsal 25% of the first metatarsal was excised. The base of the proximal phalanx was smoothed to contour with the head of the metatarsal. The head of metatarsal had one small approximately 3 mm defect. No surrounding loose cartilage was noted after debridement of the arthritic bone spur recontouring was performed and good smooth range of motion was noted of the first MTPJ. The prominent exostosis was remarkably removed. There was residual hallux valgus of the great toe. At this time the proximal phalanx osteotomy was created. A guidewire was placed in the central portion of the proximal phalanx laterally. A small wedge with the apex lateral was removed from the mid  shaft of the proximal phalanx. The toe was held in a much straighter position and a 9 x 9 mm compression staple was applied using standard technique. At this time the first intermetatarsal space was entered and the DTI L transected.  The conjoined tendon of the abductor was removed off the base of the proximal phalanx and retracted. All wounds were flushed with copious amounts or irrigation. A small capsulorrhaphy was performed on the medial aspect of the first MTPJ. Good alignment was noted in all 3 planes and the capsule was closed with a 3-0 Vicryl. Subcutaneous tissue closed with a 4-0 Vicryl. Skin closed with a 5-0 Monocryl undyed. All areas were infiltrated with 0.25% Marcaine. A well compressive sterile dressing was applied.    Patient tolerated the procedure and anesthesia well.  Was transported from the OR to the PACU with all vital signs stable and vascular status intact. To be discharged per routine protocol.  Will follow up in approximately 1 week in the outpatient clinic.

## 2016-03-23 NOTE — Anesthesia Procedure Notes (Signed)
Procedure Name: LMA Insertion Date/Time: 03/23/2016 12:18 PM Performed by: Maryan RuedWILSON, Brytney Somes M Pre-anesthesia Checklist: Patient identified, Emergency Drugs available, Suction available, Patient being monitored and Timeout performed Patient Re-evaluated:Patient Re-evaluated prior to inductionOxygen Delivery Method: Circle system utilized Preoxygenation: Pre-oxygenation with 100% oxygen Intubation Type: IV induction LMA: LMA inserted LMA Size: 4.0

## 2016-03-23 NOTE — H&P (Signed)
HISTORY AND PHYSICAL INTERVAL NOTE:  03/23/2016  12:03 PM  Angelica Wyatt  has presented today for surgery, with the diagnosis of M20.5X2 HALLUX LIMITS OF LEFT FOOT.  The various methods of treatment have been discussed with the patient.  No guarantees were given.  After consideration of risks, benefits and other options for treatment, the patient has consented to surgery.  I have reviewed the patients' chart and labs.    Patient Vitals for the past 24 hrs:  BP Temp Temp src Pulse Resp SpO2 Height Weight  03/23/16 1120 102/74 - - (!) 52 16 100 % - -  03/23/16 1115 112/68 - - 62 (!) 7 92 % - -  03/23/16 1102 126/85 - - (!) 55 12 100 % - -  03/23/16 1049 112/79 97.5 F (36.4 C) Temporal 61 16 100 % 5' 5.5" (1.664 m) 73 kg (161 lb)    A history and physical examination was performed in my office.  The patient was reexamined.  There have been no changes to this history and physical examination.  Gwyneth RevelsFowler, Elliett Guarisco A

## 2016-03-23 NOTE — Anesthesia Postprocedure Evaluation (Signed)
Anesthesia Post Note  Patient: Angelica Wyatt  Procedure(s) Performed: Procedure(s) (LRB): LEFT GREAT TOE CHEILECTOMY (Left) AIKEN OSTEOTOMY (Left)  Patient location during evaluation: PACU Anesthesia Type: General and Regional Level of consciousness: awake and alert Pain management: pain level controlled Vital Signs Assessment: post-procedure vital signs reviewed and stable Respiratory status: spontaneous breathing, nonlabored ventilation, respiratory function stable and patient connected to nasal cannula oxygen Cardiovascular status: blood pressure returned to baseline and stable Postop Assessment: no signs of nausea or vomiting Anesthetic complications: no    Durene Fruitshomas,  Senovia Gauer G

## 2016-03-23 NOTE — Transfer of Care (Signed)
Immediate Anesthesia Transfer of Care Note  Patient: Angelica Wyatt  Procedure(s) Performed: Procedure(s) with comments: LEFT GREAT TOE CHEILECTOMY (Left) - POPLITEAL  Latex senstivity AIKEN OSTEOTOMY (Left)  Patient Location: PACU  Anesthesia Type: General LMA, Regional  Level of Consciousness: awake, alert  and patient cooperative  Airway and Oxygen Therapy: Patient Spontanous Breathing and Patient connected to supplemental oxygen  Post-op Assessment: Post-op Vital signs reviewed, Patient's Cardiovascular Status Stable, Respiratory Function Stable, Patent Airway and No signs of Nausea or vomiting  Post-op Vital Signs: Reviewed and stable  Complications: No apparent anesthesia complications

## 2016-03-23 NOTE — Anesthesia Preprocedure Evaluation (Signed)
Anesthesia Evaluation  Patient identified by MRN, date of birth, ID band Patient awake    Reviewed: Allergy & Precautions, H&P , NPO status   Airway Mallampati: II  TM Distance: >3 FB Neck ROM: full    Dental   Pulmonary sleep apnea , former smoker,    Pulmonary exam normal        Cardiovascular hypertension, Normal cardiovascular exam+ Valvular Problems/Murmurs      Neuro/Psych  Neuromuscular disease    GI/Hepatic   Endo/Other    Renal/GU      Musculoskeletal   Abdominal   Peds  Hematology  (+) anemia ,   Anesthesia Other Findings   Reproductive/Obstetrics                             Anesthesia Physical Anesthesia Plan  ASA: II  Anesthesia Plan: General LMA and Regional   Post-op Pain Management:    Induction:   Airway Management Planned:   Additional Equipment:   Intra-op Plan:   Post-operative Plan:   Informed Consent: I have reviewed the patients History and Physical, chart, labs and discussed the procedure including the risks, benefits and alternatives for the proposed anesthesia with the patient or authorized representative who has indicated his/her understanding and acceptance.     Plan Discussed with: CRNA  Anesthesia Plan Comments:         Anesthesia Quick Evaluation

## 2016-03-24 ENCOUNTER — Encounter: Payer: Self-pay | Admitting: Podiatry

## 2016-03-25 LAB — SURGICAL PATHOLOGY

## 2016-05-11 ENCOUNTER — Other Ambulatory Visit: Payer: Self-pay | Admitting: Podiatry

## 2016-05-11 ENCOUNTER — Ambulatory Visit
Admission: RE | Admit: 2016-05-11 | Discharge: 2016-05-11 | Disposition: A | Discharge status: Home / Self Care | Payer: 59 | Source: Ambulatory Visit | Attending: Podiatry | Admitting: Podiatry

## 2016-05-11 DIAGNOSIS — M79662 Pain in left lower leg: Secondary | ICD-10-CM | POA: Insufficient documentation

## 2016-08-19 ENCOUNTER — Ambulatory Visit (INDEPENDENT_AMBULATORY_CARE_PROVIDER_SITE_OTHER): Payer: No Typology Code available for payment source

## 2016-08-19 ENCOUNTER — Encounter: Payer: Self-pay | Admitting: Emergency Medicine

## 2016-08-19 ENCOUNTER — Ambulatory Visit
Admission: EM | Admit: 2016-08-19 | Discharge: 2016-08-19 | Disposition: A | Discharge status: Home / Self Care | Payer: No Typology Code available for payment source | Attending: Family Medicine | Admitting: Family Medicine

## 2016-08-19 DIAGNOSIS — S90122A Contusion of left lesser toe(s) without damage to nail, initial encounter: Secondary | ICD-10-CM | POA: Diagnosis not present

## 2016-08-19 MED ORDER — HYDROCODONE-ACETAMINOPHEN 5-325 MG PO TABS: ORAL_TABLET | ORAL | 0 refills | Status: DC

## 2016-08-19 NOTE — ED Triage Notes (Signed)
Patient states that she had surgery on her left foot in September. Patient c/o left 2nd big toe pain after hitting her toe on the ironing board.  Patient states that she is in a pain clinic and physical therapy for her left foot chronic pain.

## 2016-08-19 NOTE — ED Provider Notes (Signed)
[facility]    CSN: [id number] Arrival date & time: 08/19/16  1336     History   Chief Complaint Chief Complaint  Patient presents with  . Foot Pain    HPI Angelica Wyatt is a 56 y.o. female.   56 yo female with a c/o left 2nd toe pain after hitting it/stubbing it on the ironing board at home.    The history is provided by the patient.  Foot Pain  This is a new problem. The current episode started 6 to 12 hours ago. The problem occurs constantly. The problem has not changed since onset.   Past Medical History:  Diagnosis Date  . Anemia    h/o  . Bilateral tennis elbow   . Carpal tunnel syndrome of left wrist   . Fibromyalgia   . Heart murmur    followed by PCP  . Hypertension    during pregnancy  . Sleep apnea    resolved with wt loss  . Thyroid goiter   . Thyroid goiter     There are no active problems to display for this patient.   Past Surgical History:  Procedure Laterality Date  . Quintella ReichertIKEN OSTEOTOMY Left 03/23/2016   Procedure: Quintella ReichertAIKEN OSTEOTOMY;  Surgeon: Gwyneth RevelsJustin Fowler, DPM;  [facility];  Service: Podiatry;  Laterality: Left;  . BREAST CYST ASPIRATION Left    neg  . CARPAL TUNNEL RELEASE Right   . CESAREAN SECTION     x2  . CHEILECTOMY Left 03/23/2016   Procedure: LEFT GREAT TOE CHEILECTOMY;  Surgeon: Gwyneth RevelsJustin Fowler, DPM;  [facility];  Service: Podiatry;  Laterality: Left;  POPLITEAL  Latex senstivity  . CHOLECYSTECTOMY  2008   during gastric bypass  . GASTRIC BYPASS  2008   [facility]  . LAPAROSCOPY      OB History    No data available       Home Medications    Prior to Admission medications   Medication Sig Start Date End Date Taking? Authorizing Provider  cetirizine (ZYRTEC) 10 MG tablet Take 10 mg by mouth daily.    Historical Provider, MD  Cyanocobalamin (VITAMIN B-12 IJ) Inject as directed every 30 (thirty) days.    Historical Provider, MD  Cyanocobalamin (VITAMIN B-12 PO) Take by mouth  daily.    Historical Provider, MD  diclofenac (VOLTAREN) 75 MG EC tablet Take 75 mg by mouth 2 (two) times daily as needed.    Historical Provider, MD  ergocalciferol (VITAMIN D2) 50000 units capsule Take 50,000 Units by mouth once a week.    Historical Provider, MD  Ferrous Fumarate (IRON) 18 MG TBCR Take by mouth daily.    Historical Provider, MD  HYDROcodone-acetaminophen (NORCO/VICODIN) 5-325 MG tablet 1-2 tabs po bid prn 08/19/16   Payton Mccallumrlando Frederik Standley, MD  oxyCODONE-acetaminophen (ROXICET) 5-325 MG tablet Take 1-2 tablets by mouth every 4 (four) hours as needed for severe pain. 03/23/16   Gwyneth RevelsJustin Fowler, DPM  PARoxetine (PAXIL) 10 MG tablet Take 10 mg by mouth daily.    Historical Provider, MD    Family History Family History  Problem Relation Age of Onset  . Breast cancer Maternal Aunt   . Breast cancer Cousin     Social History Social History  Substance Use Topics  . Smoking status: Former Smoker    Quit date: 07/04/2002  . Smokeless tobacco: Never Used  . Alcohol use 1.8 oz/week    3 Glasses of wine per week     Allergies  Shrimp [shellfish allergy]; Latex; and Naproxen   Review of Systems Review of Systems   Physical Exam Triage Vital Signs ED Triage Vitals  Enc Vitals Group     BP 08/19/16 1505 130/85     Pulse Rate 08/19/16 1505 68     Resp 08/19/16 1505 16     Temp 08/19/16 1505 97.8 F (36.6 C)     Temp Source 08/19/16 1505 Oral     SpO2 08/19/16 1505 100 %     Weight 08/19/16 1504 165 lb (74.8 kg)     Height 08/19/16 1504 5\' 6"  (1.676 m)     Head Circumference --      Peak Flow --      Pain Score 08/19/16 1505 8     Pain Loc --      Pain Edu? --      Excl. in GC? --    No data found.   Updated Vital Signs BP 130/85 (BP Location: Right Arm)   Pulse 68   Temp 97.8 F (36.6 C) (Oral)   Resp 16   Ht 5\' 6"  (1.676 m)   Wt 165 lb (74.8 kg)   SpO2 100%   BMI 26.63 kg/m   Visual Acuity Right Eye Distance:   Left Eye Distance:   Bilateral Distance:      Right Eye Near:   Left Eye Near:    Bilateral Near:     Physical Exam  Constitutional: She appears well-developed and well-nourished. No distress.  Musculoskeletal:       Left foot: There is tenderness, bony tenderness and swelling. There is normal range of motion, normal capillary refill, no crepitus, no deformity and no laceration.  Skin: She is not diaphoretic.  Nursing note and vitals reviewed.    UC Treatments / Results  Labs (all labs ordered are listed, but only abnormal results are displayed) Labs Reviewed - No data to display  EKG  EKG Interpretation None       Radiology Dg Foot Complete Left  Result Date: 08/19/2016 CLINICAL DATA:  Pain second digit of LEFT foot after striking at against an ironing board today, LEFT second toe injury, prior bunionectomy September 2017 EXAM: LEFT FOOT - COMPLETE 3+ VIEW COMPARISON:  None FINDINGS: Osseous demineralization. Staple at proximal phalanx great toe. Prior bunionectomy distal first metatarsal medially. Degenerative changes first MTP joint. No acute fracture, dislocation, or bone destruction. IMPRESSION: No acute osseous abnormalities. Osseous demineralization with degenerative and postsurgical changes at the first ray. Electronically Signed   By: Ulyses Southward M.D.   On: 08/19/2016 15:37    Procedures Procedures (including critical care time)  Medications Ordered in UC Medications - No data to display   Initial Impression / Assessment and Plan / UC Course  I have reviewed the triage vital signs and the nursing notes.  Pertinent labs & imaging results that were available during my care of the patient were reviewed by me and considered in my medical decision making (see chart for details).       Final Clinical Impressions(s) / UC Diagnoses   Final diagnoses:  Contusion of lesser toe of left foot without damage to nail, initial encounter    New Prescriptions Discharge Medication List as of 08/19/2016  3:48 PM     START taking these medications   Details  HYDROcodone-acetaminophen (NORCO/VICODIN) 5-325 MG tablet 1-2 tabs po bid prn, Print       1. x-ray result (negative) and diagnosis reviewed with patient 2. rx  as per orders above; reviewed possible side effects, interactions, risks and benefits  3. Recommend supportive treatment with rest, ice, elevation 4. Follow-up prn if symptoms worsen or don't improve   Payton Mccallum, MD 08/19/16 [phone]

## 2016-08-19 NOTE — Discharge Instructions (Signed)
Elevate, ice, rest

## 2016-12-22 DIAGNOSIS — M7918 Myalgia, other site: Secondary | ICD-10-CM | POA: Insufficient documentation

## 2016-12-26 ENCOUNTER — Other Ambulatory Visit: Payer: Self-pay | Admitting: Internal Medicine

## 2016-12-26 DIAGNOSIS — Z1231 Encounter for screening mammogram for malignant neoplasm of breast: Secondary | ICD-10-CM

## 2017-01-25 ENCOUNTER — Ambulatory Visit
Admission: RE | Admit: 2017-01-25 | Discharge: 2017-01-25 | Disposition: A | Discharge status: Home / Self Care | Payer: No Typology Code available for payment source | Source: Ambulatory Visit | Attending: Internal Medicine | Admitting: Internal Medicine

## 2017-01-25 DIAGNOSIS — Z1231 Encounter for screening mammogram for malignant neoplasm of breast: Secondary | ICD-10-CM | POA: Insufficient documentation

## 2017-08-29 DIAGNOSIS — G90529 Complex regional pain syndrome I of unspecified lower limb: Secondary | ICD-10-CM | POA: Insufficient documentation

## 2017-12-07 ENCOUNTER — Other Ambulatory Visit: Payer: Self-pay | Admitting: Internal Medicine

## 2017-12-07 DIAGNOSIS — Z1231 Encounter for screening mammogram for malignant neoplasm of breast: Secondary | ICD-10-CM

## 2018-01-29 ENCOUNTER — Ambulatory Visit: Payer: Self-pay

## 2018-01-30 ENCOUNTER — Ambulatory Visit: Payer: Self-pay

## 2018-01-31 ENCOUNTER — Encounter (INDEPENDENT_AMBULATORY_CARE_PROVIDER_SITE_OTHER): Payer: Self-pay

## 2018-01-31 ENCOUNTER — Ambulatory Visit
Admission: RE | Admit: 2018-01-31 | Discharge: 2018-01-31 | Disposition: A | Discharge status: Home / Self Care | Payer: BLUE CROSS/BLUE SHIELD | Source: Ambulatory Visit | Attending: Internal Medicine | Admitting: Internal Medicine

## 2018-01-31 DIAGNOSIS — Z1231 Encounter for screening mammogram for malignant neoplasm of breast: Secondary | ICD-10-CM | POA: Diagnosis not present

## 2018-03-02 ENCOUNTER — Other Ambulatory Visit: Payer: Self-pay | Admitting: Orthopedic Surgery

## 2018-03-02 DIAGNOSIS — M5441 Lumbago with sciatica, right side: Principal | ICD-10-CM

## 2018-03-02 DIAGNOSIS — M5442 Lumbago with sciatica, left side: Principal | ICD-10-CM

## 2018-03-02 DIAGNOSIS — G8929 Other chronic pain: Secondary | ICD-10-CM

## 2018-03-02 DIAGNOSIS — M545 Low back pain, unspecified: Secondary | ICD-10-CM

## 2018-03-25 ENCOUNTER — Ambulatory Visit: Admission: RE | Admit: 2018-03-25 | Payer: BLUE CROSS/BLUE SHIELD | Source: Ambulatory Visit

## 2018-03-29 ENCOUNTER — Ambulatory Visit
Admission: RE | Admit: 2018-03-29 | Discharge: 2018-03-29 | Disposition: A | Discharge status: Home / Self Care | Payer: BLUE CROSS/BLUE SHIELD | Source: Ambulatory Visit | Attending: Orthopedic Surgery | Admitting: Orthopedic Surgery

## 2018-03-29 DIAGNOSIS — M5441 Lumbago with sciatica, right side: Secondary | ICD-10-CM | POA: Insufficient documentation

## 2018-03-29 DIAGNOSIS — M8568 Other cyst of bone, other site: Secondary | ICD-10-CM | POA: Insufficient documentation

## 2018-03-29 DIAGNOSIS — M5442 Lumbago with sciatica, left side: Secondary | ICD-10-CM | POA: Diagnosis present

## 2018-03-29 DIAGNOSIS — M4316 Spondylolisthesis, lumbar region: Secondary | ICD-10-CM | POA: Insufficient documentation

## 2018-03-29 DIAGNOSIS — M545 Low back pain: Secondary | ICD-10-CM

## 2018-03-29 DIAGNOSIS — M1288 Other specific arthropathies, not elsewhere classified, other specified site: Secondary | ICD-10-CM | POA: Insufficient documentation

## 2018-03-29 DIAGNOSIS — M5136 Other intervertebral disc degeneration, lumbar region: Secondary | ICD-10-CM | POA: Insufficient documentation

## 2018-03-29 DIAGNOSIS — G8929 Other chronic pain: Secondary | ICD-10-CM | POA: Insufficient documentation

## 2018-12-12 ENCOUNTER — Other Ambulatory Visit: Payer: Self-pay | Admitting: Family

## 2018-12-12 DIAGNOSIS — Z1231 Encounter for screening mammogram for malignant neoplasm of breast: Secondary | ICD-10-CM

## 2019-02-04 ENCOUNTER — Ambulatory Visit
Admission: RE | Admit: 2019-02-04 | Discharge: 2019-02-04 | Disposition: A | Discharge status: Home / Self Care | Payer: BC Managed Care – PPO | Source: Ambulatory Visit | Attending: Family | Admitting: Family

## 2019-02-04 ENCOUNTER — Other Ambulatory Visit: Payer: Self-pay

## 2019-02-04 ENCOUNTER — Other Ambulatory Visit
Admission: RE | Admit: 2019-02-04 | Discharge: 2019-02-04 | Disposition: A | Discharge status: Home / Self Care | Payer: BC Managed Care – PPO | Source: Ambulatory Visit | Attending: Internal Medicine | Admitting: Internal Medicine

## 2019-02-04 DIAGNOSIS — Z20828 Contact with and (suspected) exposure to other viral communicable diseases: Secondary | ICD-10-CM | POA: Diagnosis not present

## 2019-02-04 DIAGNOSIS — Z1231 Encounter for screening mammogram for malignant neoplasm of breast: Secondary | ICD-10-CM | POA: Insufficient documentation

## 2019-02-04 LAB — SARS CORONAVIRUS 2 (TAT 6-24 HRS): SARS Coronavirus 2: NEGATIVE

## 2019-04-19 ENCOUNTER — Other Ambulatory Visit: Payer: Self-pay

## 2019-04-19 DIAGNOSIS — Z20822 Contact with and (suspected) exposure to covid-19: Secondary | ICD-10-CM

## 2019-04-21 LAB — NOVEL CORONAVIRUS, NAA: SARS-CoV-2, NAA: NOT DETECTED

## 2019-05-20 ENCOUNTER — Other Ambulatory Visit: Payer: Self-pay

## 2019-05-20 ENCOUNTER — Ambulatory Visit (LOCAL_COMMUNITY_HEALTH_CENTER): Payer: Self-pay

## 2019-05-20 DIAGNOSIS — Z23 Encounter for immunization: Secondary | ICD-10-CM

## 2020-01-03 ENCOUNTER — Other Ambulatory Visit: Payer: Self-pay | Admitting: Internal Medicine

## 2020-01-14 ENCOUNTER — Other Ambulatory Visit: Payer: Self-pay | Admitting: Internal Medicine

## 2020-01-14 DIAGNOSIS — Z1231 Encounter for screening mammogram for malignant neoplasm of breast: Secondary | ICD-10-CM

## 2020-02-06 ENCOUNTER — Ambulatory Visit
Admission: RE | Admit: 2020-02-06 | Discharge: 2020-02-06 | Disposition: A | Discharge status: Home / Self Care | Payer: Medicare Other | Source: Ambulatory Visit | Attending: Internal Medicine | Admitting: Internal Medicine

## 2020-02-06 ENCOUNTER — Other Ambulatory Visit: Payer: Self-pay

## 2020-02-06 DIAGNOSIS — Z1231 Encounter for screening mammogram for malignant neoplasm of breast: Secondary | ICD-10-CM | POA: Diagnosis present

## 2020-11-27 DIAGNOSIS — E039 Hypothyroidism, unspecified: Secondary | ICD-10-CM | POA: Diagnosis not present

## 2020-11-27 DIAGNOSIS — E559 Vitamin D deficiency, unspecified: Secondary | ICD-10-CM | POA: Diagnosis not present

## 2020-11-27 DIAGNOSIS — E538 Deficiency of other specified B group vitamins: Secondary | ICD-10-CM | POA: Diagnosis not present

## 2020-12-07 DIAGNOSIS — E049 Nontoxic goiter, unspecified: Secondary | ICD-10-CM | POA: Diagnosis not present

## 2020-12-07 DIAGNOSIS — E042 Nontoxic multinodular goiter: Secondary | ICD-10-CM | POA: Diagnosis not present

## 2020-12-07 DIAGNOSIS — E039 Hypothyroidism, unspecified: Secondary | ICD-10-CM | POA: Diagnosis not present

## 2020-12-07 DIAGNOSIS — J449 Chronic obstructive pulmonary disease, unspecified: Secondary | ICD-10-CM | POA: Diagnosis not present

## 2020-12-07 DIAGNOSIS — I10 Essential (primary) hypertension: Secondary | ICD-10-CM | POA: Diagnosis not present

## 2020-12-07 DIAGNOSIS — E7800 Pure hypercholesterolemia, unspecified: Secondary | ICD-10-CM | POA: Diagnosis not present

## 2020-12-07 DIAGNOSIS — Z9884 Bariatric surgery status: Secondary | ICD-10-CM | POA: Diagnosis not present

## 2020-12-07 DIAGNOSIS — K229 Disease of esophagus, unspecified: Secondary | ICD-10-CM | POA: Diagnosis not present

## 2020-12-07 DIAGNOSIS — E559 Vitamin D deficiency, unspecified: Secondary | ICD-10-CM | POA: Diagnosis not present

## 2020-12-23 ENCOUNTER — Ambulatory Visit: Payer: Self-pay

## 2020-12-23 NOTE — Telephone Encounter (Signed)
Pt. Tested positive. Other family members are currently recovering. Reviewed quarantine and home treatment. Pt. Does not have symptoms at this time. She will reach out to her PCP as needed.

## 2020-12-28 DIAGNOSIS — E7800 Pure hypercholesterolemia, unspecified: Secondary | ICD-10-CM | POA: Diagnosis not present

## 2020-12-28 DIAGNOSIS — E559 Vitamin D deficiency, unspecified: Secondary | ICD-10-CM | POA: Diagnosis not present

## 2020-12-28 DIAGNOSIS — E049 Nontoxic goiter, unspecified: Secondary | ICD-10-CM | POA: Diagnosis not present

## 2020-12-28 DIAGNOSIS — K229 Disease of esophagus, unspecified: Secondary | ICD-10-CM | POA: Diagnosis not present

## 2020-12-28 DIAGNOSIS — E039 Hypothyroidism, unspecified: Secondary | ICD-10-CM | POA: Diagnosis not present

## 2020-12-28 DIAGNOSIS — Z9884 Bariatric surgery status: Secondary | ICD-10-CM | POA: Diagnosis not present

## 2020-12-28 DIAGNOSIS — I10 Essential (primary) hypertension: Secondary | ICD-10-CM | POA: Diagnosis not present

## 2020-12-28 DIAGNOSIS — J449 Chronic obstructive pulmonary disease, unspecified: Secondary | ICD-10-CM | POA: Diagnosis not present

## 2021-02-16 ENCOUNTER — Other Ambulatory Visit: Payer: Worker's Compensation

## 2021-03-30 ENCOUNTER — Other Ambulatory Visit: Payer: Self-pay | Admitting: Internal Medicine

## 2021-03-30 DIAGNOSIS — E559 Vitamin D deficiency, unspecified: Secondary | ICD-10-CM | POA: Diagnosis not present

## 2021-03-30 DIAGNOSIS — Z23 Encounter for immunization: Secondary | ICD-10-CM | POA: Diagnosis not present

## 2021-03-30 DIAGNOSIS — Z1231 Encounter for screening mammogram for malignant neoplasm of breast: Secondary | ICD-10-CM

## 2021-03-30 DIAGNOSIS — E039 Hypothyroidism, unspecified: Secondary | ICD-10-CM | POA: Diagnosis not present

## 2021-03-30 DIAGNOSIS — E785 Hyperlipidemia, unspecified: Secondary | ICD-10-CM | POA: Diagnosis not present

## 2021-03-30 DIAGNOSIS — E538 Deficiency of other specified B group vitamins: Secondary | ICD-10-CM | POA: Diagnosis not present

## 2021-03-30 DIAGNOSIS — Z8249 Family history of ischemic heart disease and other diseases of the circulatory system: Secondary | ICD-10-CM | POA: Diagnosis not present

## 2021-04-15 ENCOUNTER — Other Ambulatory Visit: Payer: Self-pay

## 2021-04-15 ENCOUNTER — Ambulatory Visit
Admission: RE | Admit: 2021-04-15 | Discharge: 2021-04-15 | Disposition: A | Discharge status: Home / Self Care | Payer: Medicare Other | Source: Ambulatory Visit | Attending: Internal Medicine | Admitting: Internal Medicine

## 2021-04-15 DIAGNOSIS — Z1231 Encounter for screening mammogram for malignant neoplasm of breast: Secondary | ICD-10-CM | POA: Insufficient documentation

## 2021-04-22 DIAGNOSIS — G90529 Complex regional pain syndrome I of unspecified lower limb: Secondary | ICD-10-CM | POA: Diagnosis not present

## 2021-06-29 DIAGNOSIS — E049 Nontoxic goiter, unspecified: Secondary | ICD-10-CM | POA: Diagnosis not present

## 2021-06-29 DIAGNOSIS — E038 Other specified hypothyroidism: Secondary | ICD-10-CM | POA: Diagnosis not present

## 2021-06-29 DIAGNOSIS — L259 Unspecified contact dermatitis, unspecified cause: Secondary | ICD-10-CM | POA: Diagnosis not present

## 2021-06-29 DIAGNOSIS — E538 Deficiency of other specified B group vitamins: Secondary | ICD-10-CM | POA: Diagnosis not present

## 2021-08-01 DIAGNOSIS — E039 Hypothyroidism, unspecified: Secondary | ICD-10-CM | POA: Diagnosis not present

## 2021-08-01 DIAGNOSIS — J449 Chronic obstructive pulmonary disease, unspecified: Secondary | ICD-10-CM | POA: Diagnosis not present

## 2021-08-01 DIAGNOSIS — K219 Gastro-esophageal reflux disease without esophagitis: Secondary | ICD-10-CM | POA: Diagnosis not present

## 2021-08-04 DIAGNOSIS — Z20822 Contact with and (suspected) exposure to covid-19: Secondary | ICD-10-CM | POA: Diagnosis not present

## 2021-08-05 DIAGNOSIS — Z20822 Contact with and (suspected) exposure to covid-19: Secondary | ICD-10-CM | POA: Diagnosis not present

## 2021-08-06 DIAGNOSIS — Z20822 Contact with and (suspected) exposure to covid-19: Secondary | ICD-10-CM | POA: Diagnosis not present

## 2021-09-27 DIAGNOSIS — E538 Deficiency of other specified B group vitamins: Secondary | ICD-10-CM | POA: Diagnosis not present

## 2021-09-27 DIAGNOSIS — E038 Other specified hypothyroidism: Secondary | ICD-10-CM | POA: Diagnosis not present

## 2021-09-28 DIAGNOSIS — E038 Other specified hypothyroidism: Secondary | ICD-10-CM | POA: Diagnosis not present

## 2021-09-28 DIAGNOSIS — E538 Deficiency of other specified B group vitamins: Secondary | ICD-10-CM | POA: Diagnosis not present

## 2021-10-01 DIAGNOSIS — J449 Chronic obstructive pulmonary disease, unspecified: Secondary | ICD-10-CM | POA: Diagnosis not present

## 2021-10-01 DIAGNOSIS — E039 Hypothyroidism, unspecified: Secondary | ICD-10-CM | POA: Diagnosis not present

## 2021-10-01 DIAGNOSIS — K219 Gastro-esophageal reflux disease without esophagitis: Secondary | ICD-10-CM | POA: Diagnosis not present

## 2021-10-21 DIAGNOSIS — G90529 Complex regional pain syndrome I of unspecified lower limb: Secondary | ICD-10-CM | POA: Diagnosis not present

## 2021-12-28 ENCOUNTER — Other Ambulatory Visit: Payer: Self-pay | Admitting: Internal Medicine

## 2021-12-28 DIAGNOSIS — Z0000 Encounter for general adult medical examination without abnormal findings: Secondary | ICD-10-CM | POA: Diagnosis not present

## 2021-12-28 DIAGNOSIS — M8589 Other specified disorders of bone density and structure, multiple sites: Secondary | ICD-10-CM | POA: Diagnosis not present

## 2021-12-28 DIAGNOSIS — Z0001 Encounter for general adult medical examination with abnormal findings: Secondary | ICD-10-CM | POA: Diagnosis not present

## 2021-12-28 DIAGNOSIS — E039 Hypothyroidism, unspecified: Secondary | ICD-10-CM | POA: Diagnosis not present

## 2021-12-28 DIAGNOSIS — E559 Vitamin D deficiency, unspecified: Secondary | ICD-10-CM | POA: Diagnosis not present

## 2021-12-28 DIAGNOSIS — Z1231 Encounter for screening mammogram for malignant neoplasm of breast: Secondary | ICD-10-CM

## 2021-12-28 DIAGNOSIS — E538 Deficiency of other specified B group vitamins: Secondary | ICD-10-CM | POA: Diagnosis not present

## 2021-12-28 DIAGNOSIS — Z112 Encounter for screening for other bacterial diseases: Secondary | ICD-10-CM | POA: Diagnosis not present

## 2021-12-29 DIAGNOSIS — Z0000 Encounter for general adult medical examination without abnormal findings: Secondary | ICD-10-CM | POA: Diagnosis not present

## 2021-12-29 DIAGNOSIS — E559 Vitamin D deficiency, unspecified: Secondary | ICD-10-CM | POA: Diagnosis not present

## 2021-12-29 DIAGNOSIS — E538 Deficiency of other specified B group vitamins: Secondary | ICD-10-CM | POA: Diagnosis not present

## 2021-12-29 DIAGNOSIS — E039 Hypothyroidism, unspecified: Secondary | ICD-10-CM | POA: Diagnosis not present

## 2021-12-31 DIAGNOSIS — E039 Hypothyroidism, unspecified: Secondary | ICD-10-CM | POA: Diagnosis not present

## 2021-12-31 DIAGNOSIS — K219 Gastro-esophageal reflux disease without esophagitis: Secondary | ICD-10-CM | POA: Diagnosis not present

## 2021-12-31 DIAGNOSIS — J449 Chronic obstructive pulmonary disease, unspecified: Secondary | ICD-10-CM | POA: Diagnosis not present

## 2022-01-31 DIAGNOSIS — E559 Vitamin D deficiency, unspecified: Secondary | ICD-10-CM | POA: Diagnosis not present

## 2022-01-31 DIAGNOSIS — E039 Hypothyroidism, unspecified: Secondary | ICD-10-CM | POA: Diagnosis not present

## 2022-01-31 DIAGNOSIS — Z0001 Encounter for general adult medical examination with abnormal findings: Secondary | ICD-10-CM | POA: Diagnosis not present

## 2022-01-31 DIAGNOSIS — E538 Deficiency of other specified B group vitamins: Secondary | ICD-10-CM | POA: Diagnosis not present

## 2022-03-15 DIAGNOSIS — J069 Acute upper respiratory infection, unspecified: Secondary | ICD-10-CM | POA: Diagnosis not present

## 2022-03-15 DIAGNOSIS — E039 Hypothyroidism, unspecified: Secondary | ICD-10-CM | POA: Diagnosis not present

## 2022-03-15 DIAGNOSIS — J3089 Other allergic rhinitis: Secondary | ICD-10-CM | POA: Diagnosis not present

## 2022-03-15 DIAGNOSIS — Z20822 Contact with and (suspected) exposure to covid-19: Secondary | ICD-10-CM | POA: Diagnosis not present

## 2022-04-02 DIAGNOSIS — E039 Hypothyroidism, unspecified: Secondary | ICD-10-CM | POA: Diagnosis not present

## 2022-04-02 DIAGNOSIS — J449 Chronic obstructive pulmonary disease, unspecified: Secondary | ICD-10-CM | POA: Diagnosis not present

## 2022-04-02 DIAGNOSIS — K219 Gastro-esophageal reflux disease without esophagitis: Secondary | ICD-10-CM | POA: Diagnosis not present

## 2022-04-18 ENCOUNTER — Ambulatory Visit: Payer: Medicare Other

## 2022-04-18 ENCOUNTER — Ambulatory Visit
Admission: RE | Admit: 2022-04-18 | Discharge: 2022-04-18 | Disposition: A | Discharge status: Home / Self Care | Payer: Medicare Other | Source: Ambulatory Visit | Attending: Internal Medicine | Admitting: Internal Medicine

## 2022-04-18 DIAGNOSIS — Z1231 Encounter for screening mammogram for malignant neoplasm of breast: Secondary | ICD-10-CM | POA: Diagnosis not present

## 2022-04-21 IMAGING — MG MM DIGITAL SCREENING BILAT W/ TOMO AND CAD
6 of 10 series · 6 of 30 positions shown · non-contrast
Comparison: Previous exam(s).

CLINICAL DATA: Screening.

EXAM:
DIGITAL SCREENING BILATERAL MAMMOGRAM WITH TOMOSYNTHESIS AND CAD
TECHNIQUE: Bilateral screening digital craniocaudal and mediolateral oblique
mammograms were obtained. Bilateral screening digital breast
tomosynthesis was performed. The images were evaluated with
computer-aided detection.

[L MLO synth-2D (1 of 2)]
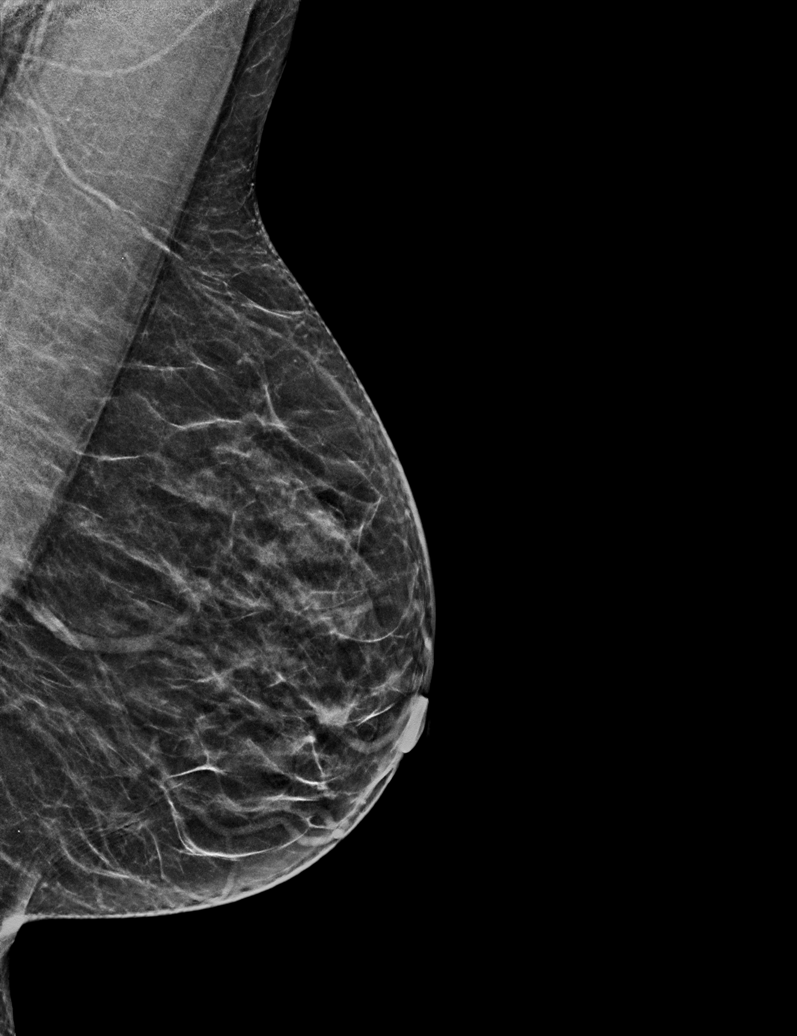

[L CC synth-2D]
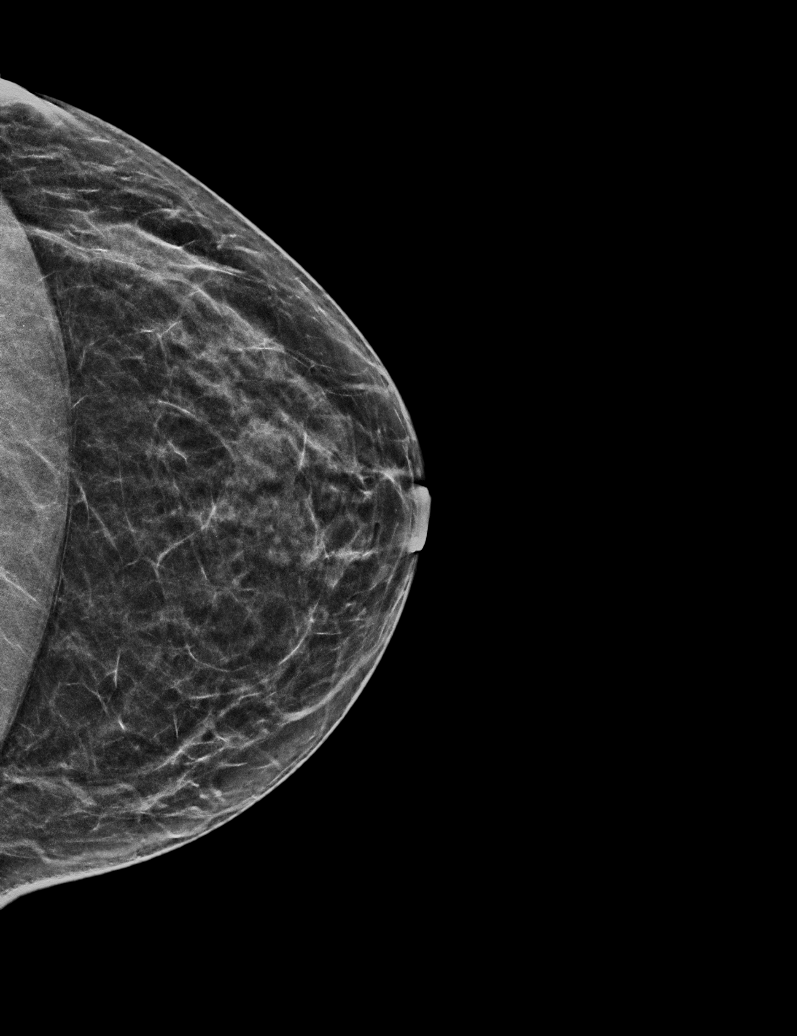

[R CC synth-2D]
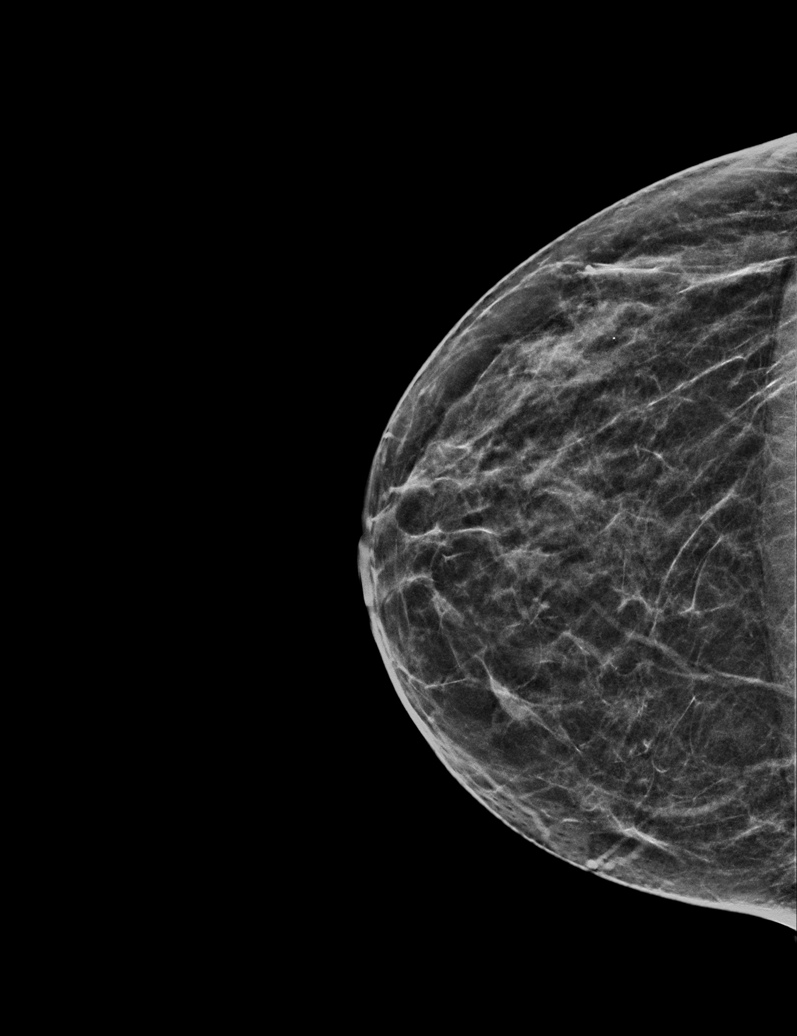

[L MLO synth-2D (2 of 2)]
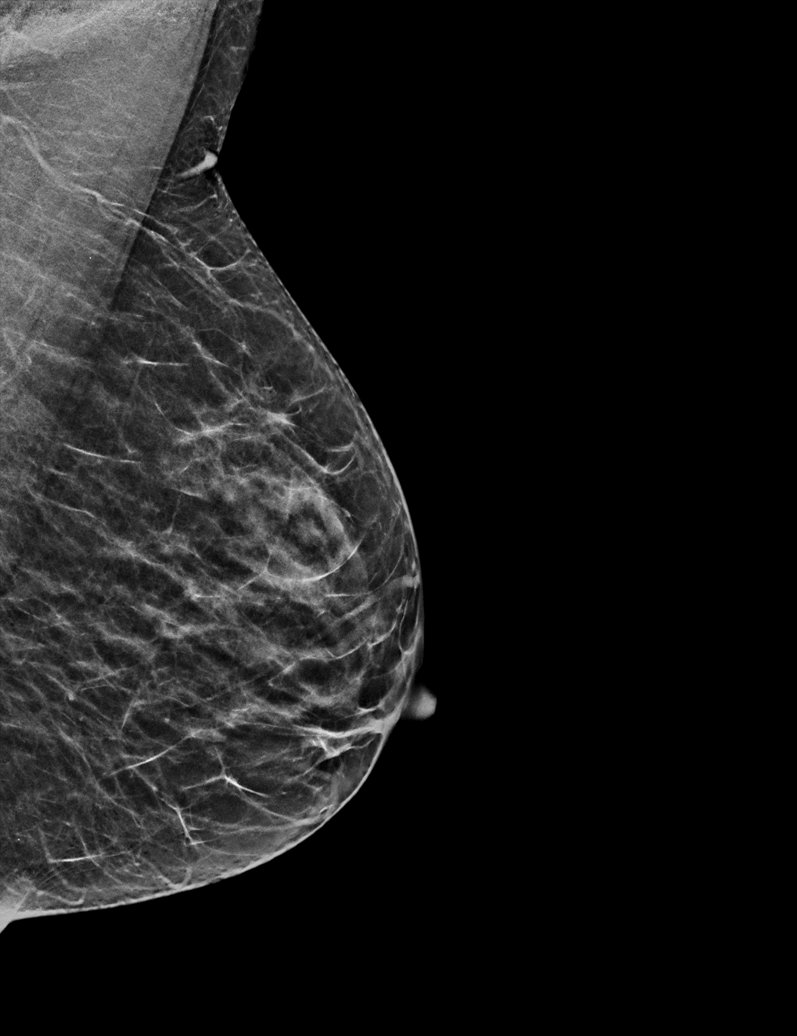

[R MLO synth-2D]
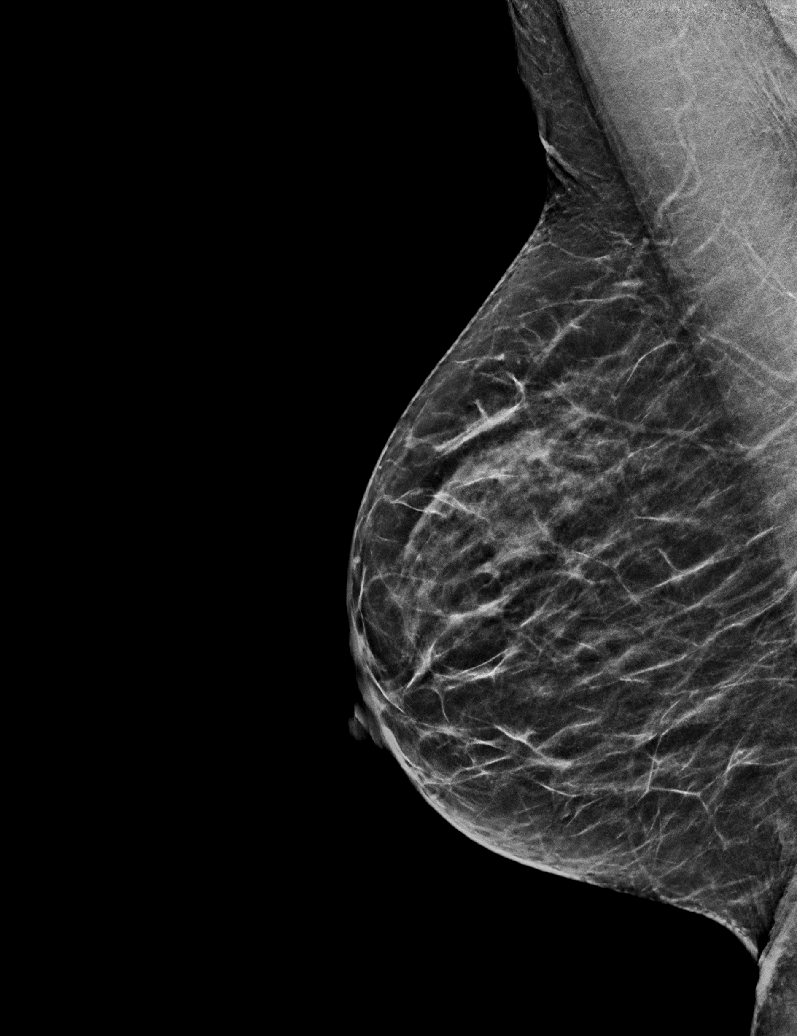

[L MLO tomo · tomo slice 23/46.0]
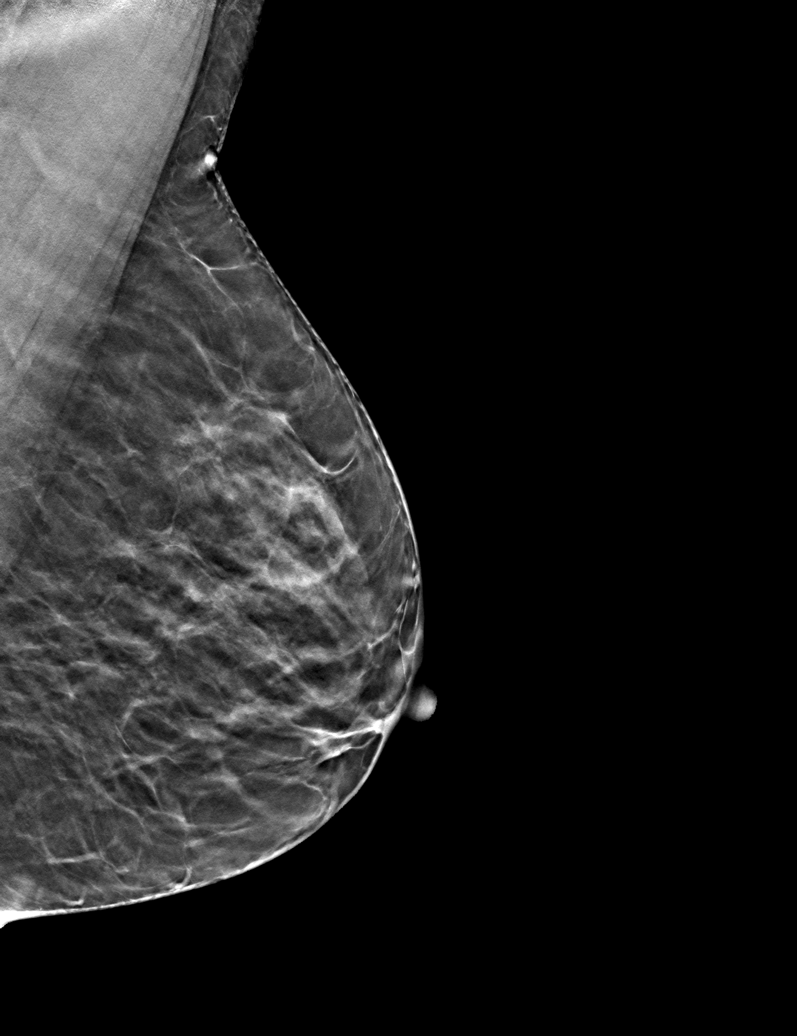

[6 of 30 positions shown; findings below may reference images not displayed]

ACR Breast Density Category b: There are scattered areas of
fibroglandular density.
FINDINGS: There are no findings suspicious for malignancy.
IMPRESSION: No mammographic evidence of malignancy. A result letter of this
screening mammogram will be mailed directly to the patient.

RECOMMENDATION:
Screening mammogram in one year. (Code:51-O-LD2)

BI-RADS CATEGORY  1: Negative.

## 2022-04-22 DIAGNOSIS — G90529 Complex regional pain syndrome I of unspecified lower limb: Secondary | ICD-10-CM | POA: Diagnosis not present

## 2022-05-17 DIAGNOSIS — E538 Deficiency of other specified B group vitamins: Secondary | ICD-10-CM | POA: Diagnosis not present

## 2022-05-17 DIAGNOSIS — M8589 Other specified disorders of bone density and structure, multiple sites: Secondary | ICD-10-CM | POA: Diagnosis not present

## 2022-05-17 DIAGNOSIS — E559 Vitamin D deficiency, unspecified: Secondary | ICD-10-CM | POA: Diagnosis not present

## 2022-05-17 DIAGNOSIS — Z79899 Other long term (current) drug therapy: Secondary | ICD-10-CM | POA: Diagnosis not present

## 2022-05-17 DIAGNOSIS — E039 Hypothyroidism, unspecified: Secondary | ICD-10-CM | POA: Diagnosis not present

## 2022-05-17 DIAGNOSIS — R29890 Loss of height: Secondary | ICD-10-CM | POA: Diagnosis not present

## 2022-05-17 DIAGNOSIS — E040 Nontoxic diffuse goiter: Secondary | ICD-10-CM | POA: Diagnosis not present

## 2022-05-18 ENCOUNTER — Other Ambulatory Visit: Payer: Self-pay | Admitting: Internal Medicine

## 2022-05-18 DIAGNOSIS — E049 Nontoxic goiter, unspecified: Secondary | ICD-10-CM

## 2022-05-20 ENCOUNTER — Ambulatory Visit
Admission: RE | Admit: 2022-05-20 | Discharge: 2022-05-20 | Disposition: A | Discharge status: Home / Self Care | Payer: Medicare Other | Source: Ambulatory Visit | Attending: Internal Medicine | Admitting: Internal Medicine

## 2022-05-20 DIAGNOSIS — E041 Nontoxic single thyroid nodule: Secondary | ICD-10-CM | POA: Diagnosis not present

## 2022-05-20 DIAGNOSIS — E049 Nontoxic goiter, unspecified: Secondary | ICD-10-CM | POA: Insufficient documentation

## 2022-05-31 DIAGNOSIS — E538 Deficiency of other specified B group vitamins: Secondary | ICD-10-CM | POA: Diagnosis not present

## 2022-05-31 DIAGNOSIS — E039 Hypothyroidism, unspecified: Secondary | ICD-10-CM | POA: Diagnosis not present

## 2022-05-31 DIAGNOSIS — M542 Cervicalgia: Secondary | ICD-10-CM | POA: Diagnosis not present

## 2022-05-31 DIAGNOSIS — S338XXA Sprain of other parts of lumbar spine and pelvis, initial encounter: Secondary | ICD-10-CM | POA: Diagnosis not present

## 2022-05-31 DIAGNOSIS — R42 Dizziness and giddiness: Secondary | ICD-10-CM | POA: Diagnosis not present

## 2022-06-07 DIAGNOSIS — R42 Dizziness and giddiness: Secondary | ICD-10-CM | POA: Diagnosis not present

## 2022-06-07 DIAGNOSIS — M542 Cervicalgia: Secondary | ICD-10-CM | POA: Diagnosis not present

## 2022-06-14 DIAGNOSIS — E538 Deficiency of other specified B group vitamins: Secondary | ICD-10-CM | POA: Diagnosis not present

## 2022-06-14 DIAGNOSIS — E039 Hypothyroidism, unspecified: Secondary | ICD-10-CM | POA: Diagnosis not present

## 2022-07-02 DIAGNOSIS — J449 Chronic obstructive pulmonary disease, unspecified: Secondary | ICD-10-CM | POA: Diagnosis not present

## 2022-07-02 DIAGNOSIS — E039 Hypothyroidism, unspecified: Secondary | ICD-10-CM | POA: Diagnosis not present

## 2022-07-02 DIAGNOSIS — K219 Gastro-esophageal reflux disease without esophagitis: Secondary | ICD-10-CM | POA: Diagnosis not present

## 2022-08-02 DIAGNOSIS — J449 Chronic obstructive pulmonary disease, unspecified: Secondary | ICD-10-CM

## 2022-08-02 DIAGNOSIS — E039 Hypothyroidism, unspecified: Secondary | ICD-10-CM

## 2022-08-02 DIAGNOSIS — K219 Gastro-esophageal reflux disease without esophagitis: Secondary | ICD-10-CM

## 2022-08-11 ENCOUNTER — Other Ambulatory Visit: Payer: Self-pay

## 2022-08-11 DIAGNOSIS — M8580 Other specified disorders of bone density and structure, unspecified site: Secondary | ICD-10-CM

## 2022-08-11 MED ORDER — MELOXICAM 15 MG PO TABS: 15.0000 mg | ORAL_TABLET | Freq: Every day | ORAL | 1 refills | Status: AC

## 2022-09-22 DIAGNOSIS — G90529 Complex regional pain syndrome I of unspecified lower limb: Secondary | ICD-10-CM | POA: Diagnosis not present

## 2022-09-26 ENCOUNTER — Ambulatory Visit (INDEPENDENT_AMBULATORY_CARE_PROVIDER_SITE_OTHER): Payer: Medicare Other | Admitting: Internal Medicine

## 2022-09-26 VITALS — BP 118/60 | HR 62 | Ht 66.0 in | Wt 161.0 lb

## 2022-09-26 DIAGNOSIS — E039 Hypothyroidism, unspecified: Secondary | ICD-10-CM

## 2022-09-26 DIAGNOSIS — E538 Deficiency of other specified B group vitamins: Secondary | ICD-10-CM | POA: Diagnosis not present

## 2022-09-26 DIAGNOSIS — F411 Generalized anxiety disorder: Secondary | ICD-10-CM | POA: Diagnosis not present

## 2022-09-26 DIAGNOSIS — E559 Vitamin D deficiency, unspecified: Secondary | ICD-10-CM | POA: Diagnosis not present

## 2022-09-26 DIAGNOSIS — N6314 Unspecified lump in the right breast, lower inner quadrant: Secondary | ICD-10-CM

## 2022-09-27 ENCOUNTER — Encounter: Payer: Self-pay | Admitting: Internal Medicine

## 2022-09-27 DIAGNOSIS — E559 Vitamin D deficiency, unspecified: Secondary | ICD-10-CM | POA: Insufficient documentation

## 2022-09-27 DIAGNOSIS — E538 Deficiency of other specified B group vitamins: Secondary | ICD-10-CM | POA: Insufficient documentation

## 2022-09-27 DIAGNOSIS — E039 Hypothyroidism, unspecified: Secondary | ICD-10-CM | POA: Insufficient documentation

## 2022-09-27 DIAGNOSIS — F411 Generalized anxiety disorder: Secondary | ICD-10-CM | POA: Insufficient documentation

## 2022-09-27 DIAGNOSIS — N6314 Unspecified lump in the right breast, lower inner quadrant: Secondary | ICD-10-CM | POA: Insufficient documentation

## 2022-09-27 NOTE — Progress Notes (Signed)
Established Patient Office Visit  Subjective:  Patient ID: TEKA WATTS, female    DOB: September 16, 1960  Age: 62 y.o. MRN: [id number]  Chief Complaint  Patient presents with   Follow-up    Mass under right breast    Patient comes in today with reports of finding lump in her right breast at 7 o'clock position.It is mobile and slightly tender. Patient had a negative mammogram in October 2023.  She noticed this mass few days ago.  Overlying skin is normal.  There is no nipple discharge.   Patient does have a history of fibrocystic changes in her breast.  She does not have a family history of breast cancer.  She otherwise has no complaints.     No other concerns at this time.   Past Medical History:  Diagnosis Date   Anemia    h/o   Bilateral tennis elbow    Carpal tunnel syndrome of left wrist    Fibromyalgia    Heart murmur    followed by PCP   Hypertension    during pregnancy   Sleep apnea    resolved with wt loss   Thyroid goiter    Thyroid goiter     Past Surgical History:  Procedure Laterality Date   Barbie Banner OSTEOTOMY Left 03/23/2016   Procedure: Barbie Banner OSTEOTOMY;  Surgeon: Samara Deist, DPM;  Location: [place];  Service: Podiatry;  Laterality: Left;   BREAST CYST ASPIRATION Left    neg   CARPAL TUNNEL RELEASE Right    CESAREAN SECTION     x2   CHEILECTOMY Left 03/23/2016   Procedure: LEFT GREAT TOE CHEILECTOMY;  Surgeon: Samara Deist, DPM;  Location: [place];  Service: Podiatry;  Laterality: Left;  POPLITEAL  Latex senstivity   CHOLECYSTECTOMY  2008   during gastric bypass   GASTRIC BYPASS  2008   [facility]   LAPAROSCOPY      Social History   Socioeconomic History   Marital status: Legally Separated    Spouse name: Not on file   Number of children: Not on file   Years of education: Not on file   Highest education level: Not on file  Occupational History   Not on file  Tobacco Use   Smoking status: Former    Types: Cigarettes     Quit date: 07/04/2002    Years since quitting: 20.2   Smokeless tobacco: Never  Substance and Sexual Activity   Alcohol use: Yes    Alcohol/week: 3.0 standard drinks of alcohol    Types: 3 Glasses of wine per week   Drug use: Not on file   Sexual activity: Not on file  Other Topics Concern   Not on file  Social History Narrative   Not on file   Social Determinants of Health   Financial Resource Strain: Not on file  Food Insecurity: Not on file  Transportation Needs: Not on file  Physical Activity: Not on file  Stress: Not on file  Social Connections: Not on file  Intimate Partner Violence: Not on file    Family History  Problem Relation Age of Onset   Breast cancer Maternal Aunt    Breast cancer Cousin        mat cousin   Breast cancer Cousin        female pat cousin    Allergies  Allergen Reactions   Shrimp [Shellfish Allergy] Hives and Swelling    angioedema   Latex     condoms  Naproxen Hives    Review of Systems  Constitutional:  Negative for chills, diaphoresis, fever, malaise/fatigue and weight loss.  HENT: Negative.    Eyes: Negative.   Respiratory: Negative.    Cardiovascular: Negative.   Gastrointestinal: Negative.   Genitourinary: Negative.   Musculoskeletal: Negative.   Skin: Negative.   Neurological: Negative.   Endo/Heme/Allergies: Negative.   Psychiatric/Behavioral: Negative.         Objective:   BP 118/60   Pulse 62   Ht 5\' 6"  (1.676 m)   Wt 161 lb (73 kg)   SpO2 99%   BMI 25.99 kg/m   Vitals:   09/26/22 1432  BP: 118/60  Pulse: 62  Height: 5\' 6"  (1.676 m)  Weight: 161 lb (73 kg)  SpO2: 99%  BMI (Calculated): 26    Physical Exam Vitals and nursing note reviewed.  Constitutional:      Appearance: Normal appearance.  Cardiovascular:     Rate and Rhythm: Normal rate and regular rhythm.     Pulses: Normal pulses.     Heart sounds: Normal heart sounds.  Pulmonary:     Effort: Pulmonary effort is normal.     Breath  sounds: Normal breath sounds.  Chest:  Breasts:    Right: Mass present. No swelling, inverted nipple, nipple discharge, skin change or tenderness.     Left: Normal.  Abdominal:     General: Abdomen is flat.     Palpations: Abdomen is soft.  Musculoskeletal:        General: Normal range of motion.     Cervical back: Normal range of motion and neck supple.  Lymphadenopathy:     Upper Body:     Right upper body: No supraclavicular, axillary or pectoral adenopathy.     Left upper body: No supraclavicular, axillary or pectoral adenopathy.  Skin:    General: Skin is warm.     Findings: No bruising, erythema or rash.  Neurological:     General: No focal deficit present.     Mental Status: She is alert and oriented to person, place, and time.      No results found for any visits on 09/26/22.  No results found for this or any previous visit (from the past 2160 hour(s)).    Assessment & Plan:  Will get a unilateral diagnostic mammogram and ultrasound of the right breast. Problem List Items Addressed This Visit     Mass of lower inner quadrant of right breast - Primary   Relevant Orders   [place] LIMITED ULTRASOUND INCLUDING AXILLA RIGHT BREAST   MM 3D DIAGNOSTIC MAMMOGRAM UNILATERAL RIGHT BREAST   Hypothyroidism   Relevant Medications   levothyroxine (SYNTHROID) 150 MCG tablet   Vitamin B12 deficiency (non anemic)   GAD (generalized anxiety disorder)   Relevant Medications   DULoxetine (CYMBALTA) 60 MG capsule   busPIRone (BUSPAR) 10 MG tablet   Vitamin D deficiency    Follow up as scheduled.   Total time spent: 25 minutes  Perrin Maltese, MD  09/26/2022

## 2022-10-11 ENCOUNTER — Ambulatory Visit
Admission: RE | Admit: 2022-10-11 | Discharge: 2022-10-11 | Disposition: A | Discharge status: Home / Self Care | Payer: Medicare Other | Source: Ambulatory Visit | Attending: Internal Medicine | Admitting: Internal Medicine

## 2022-10-11 DIAGNOSIS — N644 Mastodynia: Secondary | ICD-10-CM | POA: Diagnosis not present

## 2022-10-11 DIAGNOSIS — N6314 Unspecified lump in the right breast, lower inner quadrant: Secondary | ICD-10-CM

## 2022-10-16 ENCOUNTER — Encounter: Payer: Self-pay | Admitting: Family

## 2022-10-18 ENCOUNTER — Other Ambulatory Visit: Payer: Self-pay

## 2022-10-18 ENCOUNTER — Telehealth: Payer: Self-pay

## 2022-10-18 NOTE — Telephone Encounter (Signed)
Anxiety and/or Depression Review Call  Mastro,Everett  61 years, Female  DOB: 1961/04/06  M:   __________________________________________________ Anxiety and/or Depression Review (HC) Chart Review What recent interventions have been made by any provider to improve the patient's conditions in the last 3 months?: 09/22/22- [facility]. Has there been any documented recent hospitalizations or ED visits since last visit with Clinical Lead?: No Adherence Review Does the [facility] have access to medication refill data?: No Disease State Questions Able to connect with the Patient?: Yes Patient has: Anxiety, Depression Anxiety (GAD-7): Over the last 2 weeks, how often have you been bothered by the following problems?: Done Feeling nervous, anxious or on edge: 0 (Not at all) Not being able to stop or control worrying: 0 (Not at all) Worrying too much about different things: 0 (Not at all) Trouble relaxing: 0 (Not at all) Being so restless that it is hard to sit still: 0 (Not at all) Becoming easily annoyed or irritable: 0 (Not at all) Feeling afraid, as in something awful might happen: 0 (Not at all) Total GAD-7 Score: 0 Anxiety Severity: Minimal / none (Score: 0-4) Depression (PHQ-9): Over the last 2 weeks, how often have you been bothered by the following problems?: Done Little interest or pleasure in doing things: 0 (Not at all) Feeling down, depressed, or hopeless: 0 (Not at all) Trouble falling or staying asleep, or sleeping too much: 0 (Not at all) Feeling tired or having little energy: 0 (Not at all) Poor appetite or overeating: 0 (Not at all) Feeling bad about yourself  or that you are a failure or have let yourself or your family down: 0 (Not at all) Trouble concentrating on things, such as reading the newspaper or watching television: 0 (Not at all) Moving or speaking so slowly that other people could have noticed? Or the opposite - being so fidgety or  restless that you have been moving around a to more than usual: 0 (Not at all) Thoughts that you would be better off dead or of hurting yourself in some way: 0 (Not at all) Total PHQ-9 Score: 0 Depression Severity: Minimal / none (Score: 0-4) In your opinion, how do you feel your anxiety / depression symptoms have been controlled over the past 3 months?: Stable / stayed the same Thank patient for answering questions. Your score shows "severity of anxiety / depression". If either score is 10 or above, I advised patient that I will pass this along to the clinical lead to review and discuss further with patient.: Done Misc. Response/Information:: Patient states that she feels like she needs a refill on her Vitamin D 40981 units. She states that she feels like she is having skin changes and would like to resume the Vitamin D. Is there any way you could send in a refill or request her PCP send in a refill? Engagement Notes dimock, eugenia on 10/18/2022 02:04 PM HC Chart Review: 10 minutes (10/18/22) HC Assessment call time spent: 5 minutes (10/18/22)   Clinical Lead Review Adherence gaps identified?: No Drug Therapy Problems identified?: No Assessment: Controlled . Lynann Bologna, PharmD  Review 5 mins

## 2022-11-01 DIAGNOSIS — K219 Gastro-esophageal reflux disease without esophagitis: Secondary | ICD-10-CM | POA: Diagnosis not present

## 2022-11-01 DIAGNOSIS — E039 Hypothyroidism, unspecified: Secondary | ICD-10-CM | POA: Diagnosis not present

## 2022-11-01 DIAGNOSIS — J449 Chronic obstructive pulmonary disease, unspecified: Secondary | ICD-10-CM | POA: Diagnosis not present

## 2022-12-27 ENCOUNTER — Ambulatory Visit: Payer: Medicare Other | Admitting: Internal Medicine

## 2023-01-23 ENCOUNTER — Telehealth: Payer: Self-pay

## 2023-01-31 ENCOUNTER — Ambulatory Visit (INDEPENDENT_AMBULATORY_CARE_PROVIDER_SITE_OTHER): Payer: Medicare Other

## 2023-01-31 ENCOUNTER — Encounter: Payer: Self-pay | Admitting: Family

## 2023-01-31 ENCOUNTER — Other Ambulatory Visit: Payer: Self-pay | Admitting: Family

## 2023-01-31 ENCOUNTER — Ambulatory Visit: Payer: Medicare Other | Admitting: Family

## 2023-01-31 VITALS — BP 135/90 | HR 60 | Ht 66.0 in | Wt 176.2 lb

## 2023-01-31 DIAGNOSIS — E559 Vitamin D deficiency, unspecified: Secondary | ICD-10-CM

## 2023-01-31 DIAGNOSIS — R0602 Shortness of breath: Secondary | ICD-10-CM

## 2023-01-31 DIAGNOSIS — E039 Hypothyroidism, unspecified: Secondary | ICD-10-CM

## 2023-01-31 DIAGNOSIS — R7303 Prediabetes: Secondary | ICD-10-CM

## 2023-01-31 DIAGNOSIS — E782 Mixed hyperlipidemia: Secondary | ICD-10-CM

## 2023-01-31 DIAGNOSIS — R1084 Generalized abdominal pain: Secondary | ICD-10-CM

## 2023-01-31 DIAGNOSIS — I10 Essential (primary) hypertension: Secondary | ICD-10-CM

## 2023-01-31 DIAGNOSIS — R5383 Other fatigue: Secondary | ICD-10-CM

## 2023-01-31 DIAGNOSIS — E538 Deficiency of other specified B group vitamins: Secondary | ICD-10-CM

## 2023-01-31 MED ORDER — LORATADINE 10 MG PO TABS: 10.0000 mg | ORAL_TABLET | Freq: Every day | ORAL | 1 refills | Status: DC

## 2023-01-31 MED ORDER — ESOMEPRAZOLE MAGNESIUM 40 MG PO CPDR: 40.0000 mg | DELAYED_RELEASE_CAPSULE | Freq: Every day | ORAL | 1 refills | Status: AC

## 2023-01-31 NOTE — Assessment & Plan Note (Signed)
Checking labs today.  Will continue supplements as needed.  

## 2023-01-31 NOTE — Progress Notes (Signed)
Established Patient Office Visit  Subjective:  Patient ID: Angelica Wyatt, female    DOB: [birth year]  Age: 62 y.o. MRN: [id number]  Chief Complaint  Patient presents with   Acute Visit    Headaches, breathing problems.    Patient is here with a few concerns:  1) starting last week, she has been having Shortness of breath, Headaches for around a week and Hoarseness.  This has made working very difficult, as she has to wear headphones, which is very difficult with headaches, because of the squeezing on her head. Additionally, the shortness of breath and hoarseness make it difficult to talk, which is most of what she has to do.  2) She also says that she has been having some swelling in her legs.  She has a job that she works from home, and is seated for significant periods of time. She also says that her salt intake has increased, so this may be contributing as well.   3) Finally, she has been having difficulty with abdominal pain, specifically upper abdominal pain. She has been taking her omeprazole, but also has been having burps and gas pains.  Needs labs.  No other concerns at this time.   Past Medical History:  Diagnosis Date   Anemia    h/o   Bilateral tennis elbow    Carpal tunnel syndrome of left wrist    Fibromyalgia    Heart murmur    followed by PCP   Hypertension    during pregnancy   Left carpal tunnel syndrome 07/08/2015   Sleep apnea    resolved with wt loss   Thyroid goiter    Thyroid goiter     Past Surgical History:  Procedure Laterality Date   Quintella Reichert OSTEOTOMY Left 03/23/2016   Procedure: Quintella Reichert OSTEOTOMY;  Surgeon: Gwyneth Revels, DPM;  Location: [facility];  Service: Podiatry;  Laterality: Left;   BREAST CYST ASPIRATION Left    neg   CARPAL TUNNEL RELEASE Right    CESAREAN SECTION     x2   CHEILECTOMY Left 03/23/2016   Procedure: LEFT GREAT TOE CHEILECTOMY;  Surgeon: Gwyneth Revels, DPM;  Location: [facility];  Service: Podiatry;   Laterality: Left;  POPLITEAL  Latex senstivity   CHOLECYSTECTOMY  2008   during gastric bypass   GASTRIC BYPASS  2008   [facility]   LAPAROSCOPY      Social History   Socioeconomic History   Marital status: Divorced    Spouse name: Not on file   Number of children: Not on file   Years of education: Not on file   Highest education level: Not on file  Occupational History   Not on file  Tobacco Use   Smoking status: Former    Current packs/day: 0.00    Types: Cigarettes    Quit date: 07/04/2002    Years since quitting: 20.5   Smokeless tobacco: Never  Substance and Sexual Activity   Alcohol use: Yes    Alcohol/week: 3.0 standard drinks of alcohol    Types: 3 Glasses of wine per week   Drug use: Not on file   Sexual activity: Not on file  Other Topics Concern   Not on file  Social History Narrative   Not on file   Social Determinants of Health   Financial Resource Strain: Not on file  Food Insecurity: Not on file  Transportation Needs: Not on file  Physical Activity: Not on file  Stress: Not on file  Social Connections: Not on file  Intimate Partner Violence: Not on file    Family History  Problem Relation Age of Onset   Breast cancer Maternal Aunt    Breast cancer Cousin        mat cousin   Breast cancer Cousin        female pat cousin    Allergies  Allergen Reactions   Shrimp [Shellfish Allergy] Hives and Swelling    angioedema   Latex     condoms   Naproxen Hives    Review of Systems  Constitutional:  Positive for malaise/fatigue.  Respiratory:  Positive for shortness of breath.   Neurological:  Positive for headaches.  All other systems reviewed and are negative.      Objective:   BP (!) 135/90   Pulse 60   Ht 5\' 6"  (1.676 m)   Wt 176 lb 3.2 oz (79.9 kg)   SpO2 98%   BMI 28.44 kg/m   Vitals:   01/31/23 0933  BP: (!) 135/90  Pulse: 60  Height: 5\' 6"  (1.676 m)  Weight: 176 lb 3.2 oz (79.9 kg)  SpO2: 98%  BMI (Calculated): 28.45     Physical Exam Vitals and nursing note reviewed.  Constitutional:      Appearance: Normal appearance. She is normal weight.  HENT:     Head: Normocephalic.  Eyes:     Pupils: Pupils are equal, round, and reactive to light.  Cardiovascular:     Rate and Rhythm: Normal rate.  Pulmonary:     Effort: Pulmonary effort is normal.  Neurological:     General: No focal deficit present.     Mental Status: She is alert and oriented to person, place, and time. Mental status is at baseline.  Psychiatric:        Mood and Affect: Mood normal.        Behavior: Behavior normal.        Thought Content: Thought content normal.        Judgment: Judgment normal.      No results found for any visits on 01/31/23.  No results found for this or any previous visit (from the past 2160 hour(s)).     Assessment & Plan:   Problem List Items Addressed This Visit       Active Problems   Hypothyroidism    Patient stable.  Well controlled with current therapy.   Continue current meds.       Relevant Orders   CBC With Differential   CMP14+EGFR   TSH+T4F+T3Free   DG Chest 2 View (Completed)   Vitamin B12 deficiency (non anemic) - Primary    Checking labs today.  Will continue supplements as needed.       Relevant Orders   CBC With Differential   CMP14+EGFR   Vitamin B12   DG Chest 2 View (Completed)   Vitamin D deficiency    Checking labs today.  Will continue supplements as needed.        Relevant Orders   VITAMIN D 25 Hydroxy (Vit-D Deficiency, Fractures)   CBC With Differential   CMP14+EGFR   DG Chest 2 View (Completed)   Other Visit Diagnoses     Prediabetes       Relevant Orders   CBC With Differential   CMP14+EGFR   Hemoglobin A1c   DG Chest 2 View (Completed)   Mixed hyperlipidemia       Checking labs today Will adjust as needed at follow up.  Relevant Orders   Lipid panel   CBC With Differential   CMP14+EGFR   DG Chest 2 View (Completed)   Essential  hypertension, benign       Blood pressure has been previously well controlled, will recheck at her follow up.  I suspect this is due to her increased salt intake.   Relevant Orders   CBC With Differential   CMP14+EGFR   DG Chest 2 View (Completed)   Other fatigue       Relevant Orders   CMP14+EGFR   Iron, TIBC and Ferritin Panel   TSH+T4F+T3Free   DG Chest 2 View (Completed)   Shortness of breath       CXR today.  Pt. already did COVID test at home, was negative.  Also checking CBC with other labs today.   Relevant Orders   CMP14+EGFR   DG Chest 2 View (Completed)   Generalized abdominal pain       Will get x-ray of abdomen today. Changing to esomeprazole.  will reassess on follow up.   Relevant Orders   DG Abd 1 View (Completed)       Return in about 2 weeks (around 02/14/2023).   Total time spent: 30 minutes  Miki Kins, FNP  01/31/2023   This document may have been prepared by [facility] Voice Recognition software and as such may include unintentional dictation errors.

## 2023-01-31 NOTE — Assessment & Plan Note (Signed)
Patient stable.  Well controlled with current therapy.   Continue current meds.  

## 2023-02-06 ENCOUNTER — Telehealth: Payer: Self-pay | Admitting: Internal Medicine

## 2023-02-06 MED ORDER — LEVOTHYROXINE SODIUM 175 MCG PO TABS: 175.0000 ug | ORAL_TABLET | Freq: Every day | ORAL | 3 refills | Status: DC

## 2023-02-06 NOTE — Telephone Encounter (Signed)
Patient called wanting results of her labs and x-rays. Please advise.

## 2023-02-14 ENCOUNTER — Ambulatory Visit: Payer: Medicare Other | Admitting: Internal Medicine

## 2023-02-14 ENCOUNTER — Encounter: Payer: Self-pay | Admitting: Internal Medicine

## 2023-02-14 VITALS — BP 112/78 | HR 54 | Ht 66.0 in | Wt 169.4 lb

## 2023-02-14 DIAGNOSIS — E538 Deficiency of other specified B group vitamins: Secondary | ICD-10-CM

## 2023-02-14 DIAGNOSIS — E559 Vitamin D deficiency, unspecified: Secondary | ICD-10-CM

## 2023-02-14 DIAGNOSIS — E039 Hypothyroidism, unspecified: Secondary | ICD-10-CM

## 2023-02-14 DIAGNOSIS — F411 Generalized anxiety disorder: Secondary | ICD-10-CM

## 2023-02-14 DIAGNOSIS — J301 Allergic rhinitis due to pollen: Secondary | ICD-10-CM | POA: Insufficient documentation

## 2023-02-14 MED ORDER — ALENDRONATE SODIUM 70 MG PO TABS: 70.0000 mg | ORAL_TABLET | ORAL | 3 refills | Status: AC

## 2023-02-14 MED ORDER — LORATADINE 10 MG PO TABS: 10.0000 mg | ORAL_TABLET | Freq: Every day | ORAL | 1 refills | Status: AC

## 2023-02-14 MED ORDER — CYANOCOBALAMIN 1000 MCG/ML IJ SOLN
1000.0000 ug | Freq: Once | INTRAMUSCULAR | Status: AC
Start: 2023-02-14 — End: 2023-02-14
  Administered 2023-02-14: 1000 ug via INTRAMUSCULAR

## 2023-02-14 MED ORDER — FLUTICASONE PROPIONATE 50 MCG/ACT NA SUSP
1.0000 | Freq: Every day | NASAL | 2 refills | Status: DC
Start: 2023-02-14 — End: 2024-02-23

## 2023-02-14 MED ORDER — ERGOCALCIFEROL 1.25 MG (50000 UT) PO CAPS
50000.0000 [IU] | ORAL_CAPSULE | ORAL | 3 refills | Status: AC
Start: 2023-02-14 — End: ?

## 2023-02-14 NOTE — Progress Notes (Signed)
Established Patient Office Visit  Subjective:  Patient ID: Angelica Wyatt, female    DOB: 1961/06/27  Age: 62 y.o. MRN: [id number]  Chief Complaint  Patient presents with   Follow-up    2 week follow up    Patient comes in for her follow-up today.  She states that she is actually feeling much better than at her last visit.  Her chest x-ray and abdominal x-rays were unremarkable.  However her lab work showed several abnormalities. Her TSH has been very high so her dose of Levoxyl has been adjusted. Her vitamin B12 and vitamin D levels are very low. Will resume vitamin B12 injections every week for next 6 weeks at the office.   Vitamin D supplement sent to her  pharmacy. Patient will return for a repeat thyroid profile in 4 weeks.     No other concerns at this time.   Past Medical History:  Diagnosis Date   Anemia    h/o   Bilateral tennis elbow    Carpal tunnel syndrome of left wrist    Fibromyalgia    Heart murmur    followed by PCP   Hypertension    during pregnancy   Left carpal tunnel syndrome 07/08/2015   Sleep apnea    resolved with wt loss   Thyroid goiter    Thyroid goiter     Past Surgical History:  Procedure Laterality Date   Quintella Reichert OSTEOTOMY Left 03/23/2016   Procedure: Quintella Reichert OSTEOTOMY;  Surgeon: Gwyneth Revels, DPM;  Location: [facility];  Service: Podiatry;  Laterality: Left;   BREAST CYST ASPIRATION Left    neg   CARPAL TUNNEL RELEASE Right    CESAREAN SECTION     x2   CHEILECTOMY Left 03/23/2016   Procedure: LEFT GREAT TOE CHEILECTOMY;  Surgeon: Gwyneth Revels, DPM;  Location: [facility];  Service: Podiatry;  Laterality: Left;  POPLITEAL  Latex senstivity   CHOLECYSTECTOMY  2008   during gastric bypass   GASTRIC BYPASS  2008   [facility]   LAPAROSCOPY      Social History   Socioeconomic History   Marital status: Divorced    Spouse name: Not on file   Number of children: Not on file   Years of education: Not on file    Highest education level: Not on file  Occupational History   Not on file  Tobacco Use   Smoking status: Former    Current packs/day: 0.00    Types: Cigarettes    Quit date: 07/04/2002    Years since quitting: 20.6   Smokeless tobacco: Never  Substance and Sexual Activity   Alcohol use: Yes    Alcohol/week: 3.0 standard drinks of alcohol    Types: 3 Glasses of wine per week   Drug use: Not on file   Sexual activity: Not on file  Other Topics Concern   Not on file  Social History Narrative   Not on file   Social Determinants of Health   Financial Resource Strain: Not on file  Food Insecurity: Not on file  Transportation Needs: Not on file  Physical Activity: Not on file  Stress: Not on file  Social Connections: Not on file  Intimate Partner Violence: Not on file    Family History  Problem Relation Age of Onset   Breast cancer Maternal Aunt    Breast cancer Cousin        mat cousin   Breast cancer Cousin  female pat cousin    Allergies  Allergen Reactions   Shrimp [Shellfish Allergy] Hives and Swelling    angioedema   Latex     condoms   Naproxen Hives    Review of Systems  Constitutional: Negative.   HENT: Negative.    Eyes: Negative.   Respiratory: Negative.  Negative for cough and shortness of breath.   Cardiovascular: Negative.  Negative for chest pain, palpitations and leg swelling.  Gastrointestinal: Negative.  Negative for abdominal pain, constipation, diarrhea, heartburn, nausea and vomiting.  Genitourinary: Negative.  Negative for dysuria and flank pain.  Musculoskeletal: Negative.  Negative for joint pain and myalgias.  Skin: Negative.   Neurological: Negative.  Negative for dizziness and headaches.  Endo/Heme/Allergies: Negative.   Psychiatric/Behavioral: Negative.  Negative for depression and suicidal ideas. The patient is not nervous/anxious.        Objective:   BP 112/78   Pulse (!) 54   Ht 5\' 6"  (1.676 m)   Wt 169 lb 6.4 oz (76.8  kg)   SpO2 98%   BMI 27.34 kg/m   Vitals:   02/14/23 0911  BP: 112/78  Pulse: (!) 54  Height: 5\' 6"  (1.676 m)  Weight: 169 lb 6.4 oz (76.8 kg)  SpO2: 98%  BMI (Calculated): 27.35    Physical Exam Vitals and nursing note reviewed.  Constitutional:      Appearance: Normal appearance.  HENT:     Head: Normocephalic and atraumatic.     Nose: Nose normal.     Mouth/Throat:     Mouth: Mucous membranes are moist.     Pharynx: Oropharynx is clear.  Eyes:     Conjunctiva/sclera: Conjunctivae normal.     Pupils: Pupils are equal, round, and reactive to light.  Cardiovascular:     Rate and Rhythm: Normal rate and regular rhythm.     Pulses: Normal pulses.     Heart sounds: Normal heart sounds. No murmur heard. Pulmonary:     Effort: Pulmonary effort is normal.     Breath sounds: Normal breath sounds. No wheezing.  Abdominal:     General: Bowel sounds are normal.     Palpations: Abdomen is soft.     Tenderness: There is no abdominal tenderness. There is no right CVA tenderness or left CVA tenderness.  Musculoskeletal:        General: Normal range of motion.     Cervical back: Normal range of motion.     Right lower leg: No edema.     Left lower leg: No edema.  Skin:    General: Skin is warm and dry.  Neurological:     General: No focal deficit present.     Mental Status: She is alert and oriented to person, place, and time.  Psychiatric:        Mood and Affect: Mood normal.        Behavior: Behavior normal.      No results found for any visits on 02/14/23.  Recent Results (from the past 2160 hour(s))  CMP14+EGFR     Status: Abnormal   Collection Time: 01/31/23 11:15 AM  Result Value Ref Range   Glucose 70 70 - 99 mg/dL   BUN 18 8 - 27 mg/dL   Creatinine, Ser 5.62 (H) 0.57 - 1.00 mg/dL   eGFR 49 (L) >13 YQ/MVH/8.46   BUN/Creatinine Ratio 14 12 - 28   Sodium 141 134 - 144 mmol/L   Potassium 4.7 3.5 - 5.2 mmol/L   Chloride  102 96 - 106 mmol/L   CO2 26 20 - 29  mmol/L   Calcium 9.1 8.7 - 10.3 mg/dL   Total Protein 6.6 6.0 - 8.5 g/dL   Albumin 4.5 3.9 - 4.9 g/dL   Globulin, Total 2.1 1.5 - 4.5 g/dL   Bilirubin Total 0.3 0.0 - 1.2 mg/dL   Alkaline Phosphatase 46 44 - 121 IU/L   AST 50 (H) 0 - 40 IU/L   ALT 41 (H) 0 - 32 IU/L  Iron, TIBC and Ferritin Panel     Status: None   Collection Time: 01/31/23 11:15 AM  Result Value Ref Range   Total Iron Binding Capacity 447 250 - 450 ug/dL   UIBC 540 981 - 191 ug/dL   Iron 84 27 - 478 ug/dL   Iron Saturation 19 15 - 55 %   Ferritin 32 15 - 150 ng/mL  TSH+T4F+T3Free     Status: Abnormal   Collection Time: 01/31/23 11:15 AM  Result Value Ref Range   TSH 62.800 (H) 0.450 - 4.500 uIU/mL   T3, Free 1.0 (L) 2.0 - 4.4 pg/mL   Free T4 0.33 (L) 0.82 - 1.77 ng/dL  CBC with Differential/Platelet     Status: Abnormal   Collection Time: 01/31/23 11:15 AM  Result Value Ref Range   WBC 4.6 3.4 - 10.8 x10E3/uL   RBC 3.74 (L) 3.77 - 5.28 x10E6/uL   Hemoglobin 11.8 11.1 - 15.9 g/dL   Hematocrit 29.5 62.1 - 46.6 %   MCV 95 79 - 97 fL   MCH 31.6 26.6 - 33.0 pg   MCHC 33.3 31.5 - 35.7 g/dL   RDW 30.8 65.7 - 84.6 %   Platelets 261 150 - 450 x10E3/uL   Neutrophils 53 Not Estab. %   Lymphs 33 Not Estab. %   Monocytes 9 Not Estab. %   Eos 4 Not Estab. %   Basos 1 Not Estab. %   Neutrophils Absolute 2.5 1.4 - 7.0 x10E3/uL   Lymphocytes Absolute 1.5 0.7 - 3.1 x10E3/uL   Monocytes Absolute 0.4 0.1 - 0.9 x10E3/uL   EOS (ABSOLUTE) 0.2 0.0 - 0.4 x10E3/uL   Basophils Absolute 0.0 0.0 - 0.2 x10E3/uL   Immature Granulocytes 0 Not Estab. %   Immature Grans (Abs) 0.0 0.0 - 0.1 x10E3/uL  Lipid panel     Status: None   Collection Time: 01/31/23 11:15 AM  Result Value Ref Range   Cholesterol, Total 194 100 - 199 mg/dL   Triglycerides 88 0 - 149 mg/dL   HDL 962 >95 mg/dL   VLDL Cholesterol Cal 15 5 - 40 mg/dL   LDL Chol Calc (NIH) 61 0 - 99 mg/dL   Chol/HDL Ratio 1.6 0.0 - 4.4 ratio    Comment:                                    T. Chol/HDL Ratio                                             Men  Women                               1/2 Avg.Risk  3.4    3.3  Avg.Risk  5.0    4.4                                2X Avg.Risk  9.6    7.1                                3X Avg.Risk 23.4   11.0   Hemoglobin A1c     Status: None   Collection Time: 01/31/23 11:15 AM  Result Value Ref Range   Hgb A1c MFr Bld 5.3 4.8 - 5.6 %    Comment:          Prediabetes: 5.7 - 6.4          Diabetes: >6.4          Glycemic control for adults with diabetes: <7.0    Est. average glucose Bld gHb Est-mCnc 105 mg/dL  VITAMIN D 25 Hydroxy (Vit-D Deficiency, Fractures)     Status: Abnormal   Collection Time: 01/31/23 11:15 AM  Result Value Ref Range   Vit D, 25-Hydroxy 26.1 (L) 30.0 - 100.0 ng/mL    Comment: Vitamin D deficiency has been defined by the Institute of Medicine and an Endocrine Society practice guideline as a level of serum 25-OH vitamin D less than 20 ng/mL (1,2). The Endocrine Society went on to further define vitamin D insufficiency as a level between 21 and 29 ng/mL (2). 1. IOM (Institute of Medicine). 2010. Dietary reference    intakes for calcium and D. [place]: The    [employer]. 2. Holick MF, Binkley Bowdon, Bischoff-Ferrari HA, et al.    Evaluation, treatment, and prevention of vitamin D    deficiency: an Endocrine Society clinical practice    guideline. JCEM. 2011 Jul; 96(7):1911-30.   Vitamin B12     Status: Abnormal   Collection Time: 01/31/23 11:15 AM  Result Value Ref Range   Vitamin B-12 184 (L) 232 - 1,245 pg/mL      Assessment & Plan:  Vitamin B12 injection today and then 1 every week for next 6 weeks we will stagger them out later on.. Will get repeat TSH in 4 weeks. Problem List Items Addressed This Visit     Hypothyroidism - Primary   Vitamin B12 deficiency (non anemic)   GAD (generalized anxiety disorder)   Vitamin D deficiency    Relevant Medications   ergocalciferol (VITAMIN D2) 1.25 MG (50000 UT) capsule   Seasonal allergic rhinitis due to pollen   Relevant Medications   fluticasone (FLONASE) 50 MCG/ACT nasal spray    Return in about 3 months (around 05/17/2023).   Total time spent: 30 minutes  Margaretann Loveless, MD  02/14/2023   This document may have been prepared by [facility] Voice Recognition software and as such may include unintentional dictation errors.

## 2023-02-21 ENCOUNTER — Ambulatory Visit: Payer: Medicare Other

## 2023-02-22 ENCOUNTER — Encounter: Payer: Self-pay | Admitting: Family

## 2023-02-22 ENCOUNTER — Ambulatory Visit: Payer: Medicare Other | Admitting: Family

## 2023-02-22 DIAGNOSIS — E538 Deficiency of other specified B group vitamins: Secondary | ICD-10-CM | POA: Diagnosis not present

## 2023-02-22 MED ORDER — CYANOCOBALAMIN 1000 MCG/ML IJ SOLN
1000.0000 ug | Freq: Once | INTRAMUSCULAR | Status: AC
Start: 2023-02-22 — End: 2023-02-22
  Administered 2023-02-22: 1000 ug via INTRAMUSCULAR

## 2023-02-22 NOTE — Progress Notes (Signed)
   CHIEF COMPLAINT  B12 Shot     REASON FOR VISIT  B12 Injection     ASSESSMENT  B12 Deficiency, Unspecified     PLAN  Diagnoses and all orders for this visit:  Vitamin B12 deficiency (non anemic) -     cyanocobalamin (VITAMIN B12) injection 1,000 mcg     Pt. given B12 injection in clinic.  Return for next injection per provider instructions.   Total time spent: 5 minutes  Miki Kins, FNP  02/22/2023

## 2023-02-28 ENCOUNTER — Encounter: Payer: Self-pay | Admitting: Internal Medicine

## 2023-02-28 ENCOUNTER — Ambulatory Visit (INDEPENDENT_AMBULATORY_CARE_PROVIDER_SITE_OTHER): Payer: Medicare Other | Admitting: Internal Medicine

## 2023-02-28 DIAGNOSIS — E538 Deficiency of other specified B group vitamins: Secondary | ICD-10-CM | POA: Diagnosis not present

## 2023-02-28 MED ORDER — CYANOCOBALAMIN 1000 MCG/ML IJ SOLN
1000.0000 ug | Freq: Once | INTRAMUSCULAR | Status: AC
Start: 2023-02-28 — End: 2023-02-28
  Administered 2023-02-28: 1000 ug via INTRAMUSCULAR

## 2023-02-28 NOTE — Progress Notes (Signed)
Patient her for weekly B12 inj.

## 2023-03-07 ENCOUNTER — Ambulatory Visit (INDEPENDENT_AMBULATORY_CARE_PROVIDER_SITE_OTHER): Payer: Medicare Other | Admitting: Internal Medicine

## 2023-03-07 DIAGNOSIS — E538 Deficiency of other specified B group vitamins: Secondary | ICD-10-CM

## 2023-03-07 MED ORDER — CYANOCOBALAMIN 1000 MCG/ML IJ SOLN
1000.0000 ug | Freq: Once | INTRAMUSCULAR | Status: AC
Start: 2023-03-07 — End: 2023-03-07
  Administered 2023-03-07: 1000 ug via INTRAMUSCULAR

## 2023-03-13 ENCOUNTER — Encounter: Payer: Self-pay | Admitting: Internal Medicine

## 2023-03-13 NOTE — Progress Notes (Signed)
Vit B 12 inj.

## 2023-03-14 ENCOUNTER — Ambulatory Visit: Payer: Medicare Other | Admitting: Internal Medicine

## 2023-03-14 DIAGNOSIS — E538 Deficiency of other specified B group vitamins: Secondary | ICD-10-CM

## 2023-03-14 MED ORDER — CYANOCOBALAMIN 1000 MCG/ML IJ SOLN
1000.0000 ug | Freq: Once | INTRAMUSCULAR | Status: AC
Start: 2023-03-14 — End: 2023-03-14
  Administered 2023-03-14: 1000 ug via INTRAMUSCULAR

## 2023-03-17 DIAGNOSIS — G90522 Complex regional pain syndrome I of left lower limb: Secondary | ICD-10-CM | POA: Diagnosis not present

## 2023-03-17 DIAGNOSIS — G90529 Complex regional pain syndrome I of unspecified lower limb: Secondary | ICD-10-CM | POA: Diagnosis not present

## 2023-03-21 ENCOUNTER — Encounter: Payer: Self-pay | Admitting: Internal Medicine

## 2023-03-21 ENCOUNTER — Ambulatory Visit (INDEPENDENT_AMBULATORY_CARE_PROVIDER_SITE_OTHER): Payer: Medicare Other | Admitting: Internal Medicine

## 2023-03-21 DIAGNOSIS — E538 Deficiency of other specified B group vitamins: Secondary | ICD-10-CM

## 2023-03-21 MED ORDER — CYANOCOBALAMIN 1000 MCG/ML IJ SOLN
1000.0000 ug | Freq: Once | INTRAMUSCULAR | Status: AC
Start: 2023-03-21 — End: 2023-03-21
  Administered 2023-03-21: 1000 ug via INTRAMUSCULAR

## 2023-03-21 NOTE — Progress Notes (Signed)
Vitamin B12 injection given today.

## 2023-03-28 ENCOUNTER — Ambulatory Visit (INDEPENDENT_AMBULATORY_CARE_PROVIDER_SITE_OTHER): Payer: Medicare Other | Admitting: Internal Medicine

## 2023-03-28 ENCOUNTER — Encounter: Payer: Self-pay | Admitting: Internal Medicine

## 2023-03-28 DIAGNOSIS — E538 Deficiency of other specified B group vitamins: Secondary | ICD-10-CM

## 2023-03-28 MED ORDER — CYANOCOBALAMIN 1000 MCG/ML IJ SOLN
1000.0000 ug | Freq: Once | INTRAMUSCULAR | Status: AC
Start: 2023-03-28 — End: 2023-03-28
  Administered 2023-03-28: 1000 ug via INTRAMUSCULAR

## 2023-03-28 NOTE — Progress Notes (Signed)
Patient here for vitamin B 12 injection only.

## 2023-03-30 ENCOUNTER — Encounter: Payer: Self-pay | Admitting: Internal Medicine

## 2023-03-30 ENCOUNTER — Ambulatory Visit (INDEPENDENT_AMBULATORY_CARE_PROVIDER_SITE_OTHER): Payer: Medicare Other | Admitting: Internal Medicine

## 2023-03-30 VITALS — BP 140/76 | HR 59 | Ht 66.0 in | Wt 165.4 lb

## 2023-03-30 DIAGNOSIS — I10 Essential (primary) hypertension: Secondary | ICD-10-CM | POA: Diagnosis not present

## 2023-03-30 DIAGNOSIS — E782 Mixed hyperlipidemia: Secondary | ICD-10-CM | POA: Diagnosis not present

## 2023-03-30 DIAGNOSIS — E538 Deficiency of other specified B group vitamins: Secondary | ICD-10-CM

## 2023-03-30 DIAGNOSIS — E039 Hypothyroidism, unspecified: Secondary | ICD-10-CM | POA: Diagnosis not present

## 2023-03-30 DIAGNOSIS — J301 Allergic rhinitis due to pollen: Secondary | ICD-10-CM

## 2023-03-30 MED ORDER — ALBUTEROL SULFATE HFA 108 (90 BASE) MCG/ACT IN AERS
2.0000 | INHALATION_SPRAY | Freq: Four times a day (QID) | RESPIRATORY_TRACT | 2 refills | Status: DC | PRN
Start: 2023-03-30 — End: 2024-02-23

## 2023-03-30 NOTE — Progress Notes (Signed)
Established Patient Office Visit  Subjective:  Patient ID: Angelica Wyatt, female    DOB: May 05, 1961  Age: 62 y.o. MRN: [id number]  Chief Complaint  Patient presents with   Follow-up    3 month follow up    Patient comes in for her follow-up today.  She is generally feeling well and has been getting vitamin B12 injections weekly.  Will check her levels today. Her TSH was very high on her last labs so the dose of Levoxyl was increased.  She needs a repeat thyroid profile also. Her blood pressure is a little high but she was rushing this morning.  Need to monitor it more closely.    No other concerns at this time.   Past Medical History:  Diagnosis Date   Anemia    h/o   Bilateral tennis elbow    Carpal tunnel syndrome of left wrist    Fibromyalgia    Heart murmur    followed by PCP   Hypertension    during pregnancy   Left carpal tunnel syndrome 07/08/2015   Sleep apnea    resolved with wt loss   Thyroid goiter    Thyroid goiter     Past Surgical History:  Procedure Laterality Date   Quintella Reichert OSTEOTOMY Left 03/23/2016   Procedure: Quintella Reichert OSTEOTOMY;  Surgeon: Gwyneth Revels, DPM;  Location: [facility];  Service: Podiatry;  Laterality: Left;   BREAST CYST ASPIRATION Left    neg   CARPAL TUNNEL RELEASE Right    CESAREAN SECTION     x2   CHEILECTOMY Left 03/23/2016   Procedure: LEFT GREAT TOE CHEILECTOMY;  Surgeon: Gwyneth Revels, DPM;  Location: [facility];  Service: Podiatry;  Laterality: Left;  POPLITEAL  Latex senstivity   CHOLECYSTECTOMY  2008   during gastric bypass   GASTRIC BYPASS  2008   [facility]   LAPAROSCOPY      Social History   Socioeconomic History   Marital status: Divorced    Spouse name: Not on file   Number of children: Not on file   Years of education: Not on file   Highest education level: Not on file  Occupational History   Not on file  Tobacco Use   Smoking status: Former    Current packs/day: 0.00    Types: Cigarettes     Quit date: 07/04/2002    Years since quitting: 20.7   Smokeless tobacco: Never  Substance and Sexual Activity   Alcohol use: Yes    Alcohol/week: 3.0 standard drinks of alcohol    Types: 3 Glasses of wine per week   Drug use: Not on file   Sexual activity: Not on file  Other Topics Concern   Not on file  Social History Narrative   Not on file   Social Determinants of Health   Financial Resource Strain: Not on file  Food Insecurity: Not on file  Transportation Needs: Not on file  Physical Activity: Not on file  Stress: Not on file  Social Connections: Not on file  Intimate Partner Violence: Not on file    Family History  Problem Relation Age of Onset   Breast cancer Maternal Aunt    Breast cancer Cousin        mat cousin   Breast cancer Cousin        female pat cousin    Allergies  Allergen Reactions   Shrimp [Shellfish Allergy] Hives and Swelling    angioedema   Latex  condoms   Naproxen Hives    Review of Systems  Constitutional: Negative.  Negative for chills, diaphoresis, fever, malaise/fatigue and weight loss.  HENT: Negative.    Eyes: Negative.   Respiratory: Negative.  Negative for cough and shortness of breath.   Cardiovascular: Negative.  Negative for chest pain, palpitations and leg swelling.  Gastrointestinal: Negative.  Negative for abdominal pain, constipation, diarrhea, heartburn, nausea and vomiting.  Genitourinary: Negative.  Negative for dysuria and flank pain.  Musculoskeletal: Negative.  Negative for joint pain and myalgias.  Skin: Negative.   Neurological: Negative.  Negative for dizziness and headaches.  Endo/Heme/Allergies: Negative.   Psychiatric/Behavioral: Negative.  Negative for depression and suicidal ideas. The patient is not nervous/anxious.        Objective:   BP (!) 140/76   Pulse (!) 59   Ht 5\' 6"  (1.676 m)   Wt 165 lb 6.4 oz (75 kg)   SpO2 98%   BMI 26.70 kg/m   Vitals:   03/30/23 0900  BP: (!) 140/76  Pulse:  (!) 59  Height: 5\' 6"  (1.676 m)  Weight: 165 lb 6.4 oz (75 kg)  SpO2: 98%  BMI (Calculated): 26.71    Physical Exam Vitals and nursing note reviewed.  Constitutional:      Appearance: Normal appearance.  HENT:     Head: Normocephalic and atraumatic.     Nose: Nose normal.     Mouth/Throat:     Mouth: Mucous membranes are moist.     Pharynx: Oropharynx is clear.  Eyes:     Conjunctiva/sclera: Conjunctivae normal.     Pupils: Pupils are equal, round, and reactive to light.  Cardiovascular:     Rate and Rhythm: Normal rate and regular rhythm.     Pulses: Normal pulses.     Heart sounds: Normal heart sounds. No murmur heard. Pulmonary:     Effort: Pulmonary effort is normal.     Breath sounds: Normal breath sounds. No wheezing.  Abdominal:     General: Bowel sounds are normal.     Palpations: Abdomen is soft.     Tenderness: There is no abdominal tenderness. There is no right CVA tenderness or left CVA tenderness.  Musculoskeletal:        General: Normal range of motion.     Cervical back: Normal range of motion.     Right lower leg: No edema.     Left lower leg: No edema.  Skin:    General: Skin is warm and dry.  Neurological:     General: No focal deficit present.     Mental Status: She is alert and oriented to person, place, and time.  Psychiatric:        Mood and Affect: Mood normal.        Behavior: Behavior normal.      No results found for any visits on 03/30/23.  Recent Results (from the past 2160 hour(s))  CMP14+EGFR     Status: Abnormal   Collection Time: 01/31/23 11:15 AM  Result Value Ref Range   Glucose 70 70 - 99 mg/dL   BUN 18 8 - 27 mg/dL   Creatinine, Ser 1.61 (H) 0.57 - 1.00 mg/dL   eGFR 49 (L) >09 UE/AVW/0.98   BUN/Creatinine Ratio 14 12 - 28   Sodium 141 134 - 144 mmol/L   Potassium 4.7 3.5 - 5.2 mmol/L   Chloride 102 96 - 106 mmol/L   CO2 26 20 - 29 mmol/L   Calcium 9.1 8.7 -  10.3 mg/dL   Total Protein 6.6 6.0 - 8.5 g/dL   Albumin 4.5  3.9 - 4.9 g/dL   Globulin, Total 2.1 1.5 - 4.5 g/dL   Bilirubin Total 0.3 0.0 - 1.2 mg/dL   Alkaline Phosphatase 46 44 - 121 IU/L   AST 50 (H) 0 - 40 IU/L   ALT 41 (H) 0 - 32 IU/L  Iron, TIBC and Ferritin Panel     Status: None   Collection Time: 01/31/23 11:15 AM  Result Value Ref Range   Total Iron Binding Capacity 447 250 - 450 ug/dL   UIBC 696 295 - 284 ug/dL   Iron 84 27 - 132 ug/dL   Iron Saturation 19 15 - 55 %   Ferritin 32 15 - 150 ng/mL  TSH+T4F+T3Free     Status: Abnormal   Collection Time: 01/31/23 11:15 AM  Result Value Ref Range   TSH 62.800 (H) 0.450 - 4.500 uIU/mL   T3, Free 1.0 (L) 2.0 - 4.4 pg/mL   Free T4 0.33 (L) 0.82 - 1.77 ng/dL  CBC with Differential/Platelet     Status: Abnormal   Collection Time: 01/31/23 11:15 AM  Result Value Ref Range   WBC 4.6 3.4 - 10.8 x10E3/uL   RBC 3.74 (L) 3.77 - 5.28 x10E6/uL   Hemoglobin 11.8 11.1 - 15.9 g/dL   Hematocrit 44.0 10.2 - 46.6 %   MCV 95 79 - 97 fL   MCH 31.6 26.6 - 33.0 pg   MCHC 33.3 31.5 - 35.7 g/dL   RDW 72.5 36.6 - 44.0 %   Platelets 261 150 - 450 x10E3/uL   Neutrophils 53 Not Estab. %   Lymphs 33 Not Estab. %   Monocytes 9 Not Estab. %   Eos 4 Not Estab. %   Basos 1 Not Estab. %   Neutrophils Absolute 2.5 1.4 - 7.0 x10E3/uL   Lymphocytes Absolute 1.5 0.7 - 3.1 x10E3/uL   Monocytes Absolute 0.4 0.1 - 0.9 x10E3/uL   EOS (ABSOLUTE) 0.2 0.0 - 0.4 x10E3/uL   Basophils Absolute 0.0 0.0 - 0.2 x10E3/uL   Immature Granulocytes 0 Not Estab. %   Immature Grans (Abs) 0.0 0.0 - 0.1 x10E3/uL  Lipid panel     Status: None   Collection Time: 01/31/23 11:15 AM  Result Value Ref Range   Cholesterol, Total 194 100 - 199 mg/dL   Triglycerides 88 0 - 149 mg/dL   HDL 347 >42 mg/dL   VLDL Cholesterol Cal 15 5 - 40 mg/dL   LDL Chol Calc (NIH) 61 0 - 99 mg/dL   Chol/HDL Ratio 1.6 0.0 - 4.4 ratio    Comment:                                   T. Chol/HDL Ratio                                             Men  Women                                1/2 Avg.Risk  3.4    3.3  Avg.Risk  5.0    4.4                                2X Avg.Risk  9.6    7.1                                3X Avg.Risk 23.4   11.0   Hemoglobin A1c     Status: None   Collection Time: 01/31/23 11:15 AM  Result Value Ref Range   Hgb A1c MFr Bld 5.3 4.8 - 5.6 %    Comment:          Prediabetes: 5.7 - 6.4          Diabetes: >6.4          Glycemic control for adults with diabetes: <7.0    Est. average glucose Bld gHb Est-mCnc 105 mg/dL  VITAMIN D 25 Hydroxy (Vit-D Deficiency, Fractures)     Status: Abnormal   Collection Time: 01/31/23 11:15 AM  Result Value Ref Range   Vit D, 25-Hydroxy 26.1 (L) 30.0 - 100.0 ng/mL    Comment: Vitamin D deficiency has been defined by the Institute of Medicine and an Endocrine Society practice guideline as a level of serum 25-OH vitamin D less than 20 ng/mL (1,2). The Endocrine Society went on to further define vitamin D insufficiency as a level between 21 and 29 ng/mL (2). 1. IOM (Institute of Medicine). 2010. Dietary reference    intakes for calcium and D. [place]: The    [employer]. 2. Holick MF, Binkley Wise, Bischoff-Ferrari HA, et al.    Evaluation, treatment, and prevention of vitamin D    deficiency: an Endocrine Society clinical practice    guideline. JCEM. 2011 Jul; 96(7):1911-30.   Vitamin B12     Status: Abnormal   Collection Time: 01/31/23 11:15 AM  Result Value Ref Range   Vitamin B-12 184 (L) 232 - 1,245 pg/mL      Assessment & Plan:  Continue all meds. Check labs today. Monitor blood pressure. Problem List Items Addressed This Visit     Hypothyroidism   Relevant Orders   TSH+T4F+T3Free   Vitamin B12 deficiency (non anemic)   Relevant Orders   Vitamin B12   Seasonal allergic rhinitis due to pollen   Relevant Medications   albuterol (VENTOLIN HFA) 108 (90 Base) MCG/ACT inhaler   Mixed hyperlipidemia   Other Visit  Diagnoses     Essential hypertension, benign    -  Primary       Return in about 6 weeks (around 05/11/2023).   Total time spent: 30 minutes  Margaretann Loveless, MD  03/30/2023   This document may have been prepared by [facility] Voice Recognition software and as such may include unintentional dictation errors.

## 2023-03-31 LAB — TSH+T4F+T3FREE
Free T4: 1.48 ng/dL (ref 0.82–1.77)
T3, Free: 2.2 pg/mL (ref 2.0–4.4)
TSH: 2.06 u[IU]/mL (ref 0.450–4.500)

## 2023-03-31 LAB — VITAMIN B12: Vitamin B-12: 850 pg/mL (ref 232–1245)

## 2023-04-04 NOTE — Progress Notes (Signed)
Patient notified

## 2023-04-16 ENCOUNTER — Other Ambulatory Visit: Payer: Self-pay | Admitting: Internal Medicine

## 2023-04-18 ENCOUNTER — Ambulatory Visit: Payer: Medicare Other

## 2023-05-02 ENCOUNTER — Ambulatory Visit (INDEPENDENT_AMBULATORY_CARE_PROVIDER_SITE_OTHER): Payer: Medicare Other | Admitting: Family

## 2023-05-02 DIAGNOSIS — E538 Deficiency of other specified B group vitamins: Secondary | ICD-10-CM

## 2023-05-02 MED ORDER — CYANOCOBALAMIN 1000 MCG/ML IJ SOLN
1000.0000 ug | Freq: Once | INTRAMUSCULAR | Status: AC
Start: 2023-05-02 — End: 2023-05-02
  Administered 2023-05-02: 1000 ug via INTRAMUSCULAR

## 2023-05-07 NOTE — Progress Notes (Signed)
   CHIEF COMPLAINT  B12 Shot     REASON FOR VISIT  B12 Injection     ASSESSMENT  B12 Deficiency, Unspecified     PLAN  Diagnoses and all orders for this visit:  Vitamin B12 deficiency (non anemic) -     cyanocobalamin (VITAMIN B12) injection 1,000 mcg     Pt. given B12 injection in clinic.  Return for next injection per provider instructions.   Total time spent: 5 minutes  Miki Kins, FNP  05/02/2023

## 2023-05-11 ENCOUNTER — Encounter: Payer: Self-pay | Admitting: Internal Medicine

## 2023-05-11 ENCOUNTER — Ambulatory Visit (INDEPENDENT_AMBULATORY_CARE_PROVIDER_SITE_OTHER): Payer: Medicare Other | Admitting: Internal Medicine

## 2023-05-11 VITALS — BP 112/82 | HR 64 | Ht 65.0 in | Wt 167.4 lb

## 2023-05-11 DIAGNOSIS — E538 Deficiency of other specified B group vitamins: Secondary | ICD-10-CM | POA: Diagnosis not present

## 2023-05-11 DIAGNOSIS — E559 Vitamin D deficiency, unspecified: Secondary | ICD-10-CM | POA: Diagnosis not present

## 2023-05-11 DIAGNOSIS — Z23 Encounter for immunization: Secondary | ICD-10-CM

## 2023-05-11 DIAGNOSIS — R1084 Generalized abdominal pain: Secondary | ICD-10-CM | POA: Diagnosis not present

## 2023-05-11 DIAGNOSIS — E039 Hypothyroidism, unspecified: Secondary | ICD-10-CM

## 2023-05-11 DIAGNOSIS — I10 Essential (primary) hypertension: Secondary | ICD-10-CM

## 2023-05-11 NOTE — Progress Notes (Signed)
Established Patient Office Visit  Subjective:  Patient ID: Angelica Wyatt, female    DOB: 06/08/1961  Age: 62 y.o. MRN: [id number]  Chief Complaint  Patient presents with   Follow-up    6 week follow up    Patient is here for her follow-up today.  She has been getting vitamin B12 injections regularly.  Also takes a vitamin D.  She is generally feeling well, has more energy, however she is having some family stressors.  Her blood pressure is looking better today. She will get blood work as well as a flu shot today.    No other concerns at this time.   Past Medical History:  Diagnosis Date   Anemia    h/o   Bilateral tennis elbow    Carpal tunnel syndrome of left wrist    Fibromyalgia    Heart murmur    followed by PCP   Hypertension    during pregnancy   Left carpal tunnel syndrome 07/08/2015   Sleep apnea    resolved with wt loss   Thyroid goiter    Thyroid goiter     Past Surgical History:  Procedure Laterality Date   Quintella Reichert OSTEOTOMY Left 03/23/2016   Procedure: Quintella Reichert OSTEOTOMY;  Surgeon: Gwyneth Revels, DPM;  Location: [facility];  Service: Podiatry;  Laterality: Left;   BREAST CYST ASPIRATION Left    neg   CARPAL TUNNEL RELEASE Right    CESAREAN SECTION     x2   CHEILECTOMY Left 03/23/2016   Procedure: LEFT GREAT TOE CHEILECTOMY;  Surgeon: Gwyneth Revels, DPM;  Location: [facility];  Service: Podiatry;  Laterality: Left;  POPLITEAL  Latex senstivity   CHOLECYSTECTOMY  2008   during gastric bypass   GASTRIC BYPASS  2008   [facility]   LAPAROSCOPY      Social History   Socioeconomic History   Marital status: Divorced    Spouse name: Not on file   Number of children: Not on file   Years of education: Not on file   Highest education level: Not on file  Occupational History   Not on file  Tobacco Use   Smoking status: Former    Current packs/day: 0.00    Types: Cigarettes    Quit date: 07/04/2002    Years since quitting: 20.8    Smokeless tobacco: Never  Substance and Sexual Activity   Alcohol use: Yes    Alcohol/week: 3.0 standard drinks of alcohol    Types: 3 Glasses of wine per week   Drug use: Not on file   Sexual activity: Not on file  Other Topics Concern   Not on file  Social History Narrative   Not on file   Social Determinants of Health   Financial Resource Strain: Not on file  Food Insecurity: Not on file  Transportation Needs: Not on file  Physical Activity: Not on file  Stress: Not on file  Social Connections: Not on file  Intimate Partner Violence: Not on file    Family History  Problem Relation Age of Onset   Breast cancer Maternal Aunt    Breast cancer Cousin        mat cousin   Breast cancer Cousin        female pat cousin    Allergies  Allergen Reactions   Shrimp [Shellfish Allergy] Hives and Swelling    angioedema   Latex     condoms   Naproxen Hives    Review of Systems  Constitutional: Negative.  Negative for chills, fever, malaise/fatigue and weight loss.  HENT: Negative.    Eyes: Negative.   Respiratory: Negative.  Negative for cough and shortness of breath.   Cardiovascular: Negative.  Negative for chest pain, palpitations and leg swelling.  Gastrointestinal: Negative.  Negative for abdominal pain, constipation, diarrhea, heartburn, nausea and vomiting.  Genitourinary: Negative.  Negative for dysuria and flank pain.  Musculoskeletal: Negative.  Negative for joint pain and myalgias.  Skin: Negative.   Neurological: Negative.  Negative for dizziness and headaches.  Endo/Heme/Allergies: Negative.   Psychiatric/Behavioral:  Negative for depression and suicidal ideas. The patient is nervous/anxious.        Objective:   BP 112/82   Pulse 64   Ht 5\' 5"  (1.651 m)   Wt 167 lb 6.4 oz (75.9 kg)   SpO2 96%   BMI 27.86 kg/m   Vitals:   05/11/23 0922  BP: 112/82  Pulse: 64  Height: 5\' 5"  (1.651 m)  Weight: 167 lb 6.4 oz (75.9 kg)  SpO2: 96%  BMI (Calculated):  27.86    Physical Exam Vitals and nursing note reviewed.  Constitutional:      Appearance: Normal appearance.  HENT:     Head: Normocephalic and atraumatic.     Nose: Nose normal.     Mouth/Throat:     Mouth: Mucous membranes are moist.     Pharynx: Oropharynx is clear.  Eyes:     Conjunctiva/sclera: Conjunctivae normal.     Pupils: Pupils are equal, round, and reactive to light.  Cardiovascular:     Rate and Rhythm: Normal rate and regular rhythm.     Pulses: Normal pulses.     Heart sounds: Normal heart sounds. No murmur heard. Pulmonary:     Effort: Pulmonary effort is normal.     Breath sounds: Normal breath sounds. No wheezing.  Abdominal:     General: Bowel sounds are normal.     Palpations: Abdomen is soft.     Tenderness: There is no abdominal tenderness. There is no right CVA tenderness or left CVA tenderness.  Musculoskeletal:        General: Normal range of motion.     Cervical back: Normal range of motion.     Right lower leg: No edema.     Left lower leg: No edema.  Skin:    General: Skin is warm and dry.  Neurological:     General: No focal deficit present.     Mental Status: She is alert and oriented to person, place, and time.  Psychiatric:        Mood and Affect: Mood normal.        Behavior: Behavior normal.      No results found for any visits on 05/11/23.  Recent Results (from the past 2160 hour(s))  TSH+T4F+T3Free     Status: None   Collection Time: 03/30/23  9:38 AM  Result Value Ref Range   TSH 2.060 0.450 - 4.500 uIU/mL   T3, Free 2.2 2.0 - 4.4 pg/mL   Free T4 1.48 0.82 - 1.77 ng/dL  Vitamin Z61     Status: None   Collection Time: 03/30/23  9:38 AM  Result Value Ref Range   Vitamin B-12 850 232 - 1,245 pg/mL      Assessment & Plan:  To continue all medications.  Monitor blood pressure at home. Labs today. Problem List Items Addressed This Visit     Hypothyroidism - Primary   Relevant Orders  CMP14+EGFR   TSH+T4F+T3Free    Vitamin B12 deficiency (non anemic)   Relevant Orders   Vitamin B12   Vitamin D deficiency   Relevant Orders   Vitamin D (25 hydroxy)   Other Visit Diagnoses     Need for immunization against influenza       Relevant Orders   Influenza, MDCK, trivalent, PF(Flucelvax egg-free) (Completed)       Return in about 3 months (around 08/11/2023).   Total time spent: 30 minutes  Margaretann Loveless, MD  05/11/2023   This document may have been prepared by [facility] Voice Recognition software and as such may include unintentional dictation errors.

## 2023-05-12 LAB — TSH+T4F+T3FREE
Free T4: 1.3 ng/dL (ref 0.82–1.77)
T3, Free: 2.2 pg/mL (ref 2.0–4.4)
TSH: 2.62 u[IU]/mL (ref 0.450–4.500)

## 2023-05-12 LAB — CMP14+EGFR
ALT: 20 [IU]/L (ref 0–32)
AST: 27 [IU]/L (ref 0–40)
Albumin: 4.3 g/dL (ref 3.9–4.9)
Alkaline Phosphatase: 44 [IU]/L (ref 44–121)
BUN/Creatinine Ratio: 11 — ABNORMAL LOW (ref 12–28)
BUN: 9 mg/dL (ref 8–27)
Bilirubin Total: 0.3 mg/dL (ref 0.0–1.2)
CO2: 25 mmol/L (ref 20–29)
Calcium: 9.2 mg/dL (ref 8.7–10.3)
Chloride: 104 mmol/L (ref 96–106)
Creatinine, Ser: 0.82 mg/dL (ref 0.57–1.00)
Globulin, Total: 2 g/dL (ref 1.5–4.5)
Glucose: 75 mg/dL (ref 70–99)
Potassium: 4.6 mmol/L (ref 3.5–5.2)
Sodium: 141 mmol/L (ref 134–144)
Total Protein: 6.3 g/dL (ref 6.0–8.5)
eGFR: 81 mL/min/{1.73_m2} (ref >59)

## 2023-05-12 LAB — VITAMIN B12: Vitamin B-12: 442 pg/mL (ref 232–1245)

## 2023-05-12 LAB — VITAMIN D 25 HYDROXY (VIT D DEFICIENCY, FRACTURES): Vit D, 25-Hydroxy: 42.2 ng/mL (ref 30.0–100.0)

## 2023-05-16 ENCOUNTER — Ambulatory Visit: Payer: Medicare Other | Admitting: Family

## 2023-05-16 DIAGNOSIS — E538 Deficiency of other specified B group vitamins: Secondary | ICD-10-CM

## 2023-05-16 MED ORDER — CYANOCOBALAMIN 1000 MCG/ML IJ SOLN
1000.0000 ug | Freq: Once | INTRAMUSCULAR | Status: AC
Start: 2023-05-16 — End: 2023-05-16
  Administered 2023-05-16: 1000 ug via INTRAMUSCULAR

## 2023-05-18 ENCOUNTER — Ambulatory Visit: Payer: Medicare Other | Admitting: Internal Medicine

## 2023-05-30 ENCOUNTER — Ambulatory Visit: Payer: Medicare Other | Admitting: Family

## 2023-05-30 DIAGNOSIS — E538 Deficiency of other specified B group vitamins: Secondary | ICD-10-CM | POA: Diagnosis not present

## 2023-05-30 MED ORDER — CYANOCOBALAMIN 1000 MCG/ML IJ SOLN
1000.0000 ug | Freq: Once | INTRAMUSCULAR | Status: AC
Start: 2023-05-30 — End: 2023-05-30
  Administered 2023-05-30: 1000 ug via INTRAMUSCULAR

## 2023-06-13 ENCOUNTER — Ambulatory Visit: Payer: Medicare Other

## 2023-06-26 ENCOUNTER — Encounter: Payer: Self-pay | Admitting: Internal Medicine

## 2023-06-26 ENCOUNTER — Ambulatory Visit (INDEPENDENT_AMBULATORY_CARE_PROVIDER_SITE_OTHER): Payer: Medicare Other | Admitting: Internal Medicine

## 2023-06-26 DIAGNOSIS — E538 Deficiency of other specified B group vitamins: Secondary | ICD-10-CM

## 2023-06-26 MED ORDER — CYANOCOBALAMIN 1000 MCG/ML IJ SOLN
1000.0000 ug | Freq: Once | INTRAMUSCULAR | Status: AC
Start: 2023-06-26 — End: 2023-06-26
  Administered 2023-06-26: 1000 ug via INTRAMUSCULAR

## 2023-06-26 NOTE — Progress Notes (Signed)
Vi B12 injection only-

## 2023-07-11 ENCOUNTER — Ambulatory Visit: Payer: Medicare Other | Admitting: Internal Medicine

## 2023-07-11 ENCOUNTER — Encounter: Payer: Self-pay | Admitting: Internal Medicine

## 2023-07-11 DIAGNOSIS — E538 Deficiency of other specified B group vitamins: Secondary | ICD-10-CM

## 2023-07-11 MED ORDER — CYANOCOBALAMIN 1000 MCG/ML IJ SOLN
1000.0000 ug | Freq: Once | INTRAMUSCULAR | Status: AC
Start: 2023-07-11 — End: 2023-07-11
  Administered 2023-07-11: 1000 ug via INTRAMUSCULAR

## 2023-07-11 NOTE — Progress Notes (Signed)
 Patient in for Vit B12 inj only-

## 2023-07-25 ENCOUNTER — Ambulatory Visit: Payer: Medicare Other

## 2023-07-27 NOTE — Progress Notes (Signed)
B12 injection given to patient.

## 2023-08-11 ENCOUNTER — Ambulatory Visit (INDEPENDENT_AMBULATORY_CARE_PROVIDER_SITE_OTHER): Payer: Medicare Other | Admitting: Internal Medicine

## 2023-08-11 ENCOUNTER — Encounter: Payer: Self-pay | Admitting: Internal Medicine

## 2023-08-11 VITALS — BP 120/78 | HR 75 | Ht 66.0 in | Wt 175.2 lb

## 2023-08-11 DIAGNOSIS — K219 Gastro-esophageal reflux disease without esophagitis: Secondary | ICD-10-CM | POA: Diagnosis not present

## 2023-08-11 DIAGNOSIS — E782 Mixed hyperlipidemia: Secondary | ICD-10-CM

## 2023-08-11 DIAGNOSIS — E039 Hypothyroidism, unspecified: Secondary | ICD-10-CM

## 2023-08-11 DIAGNOSIS — Z1231 Encounter for screening mammogram for malignant neoplasm of breast: Secondary | ICD-10-CM | POA: Diagnosis not present

## 2023-08-11 DIAGNOSIS — I10 Essential (primary) hypertension: Secondary | ICD-10-CM | POA: Diagnosis not present

## 2023-08-11 DIAGNOSIS — E538 Deficiency of other specified B group vitamins: Secondary | ICD-10-CM | POA: Diagnosis not present

## 2023-08-11 DIAGNOSIS — F411 Generalized anxiety disorder: Secondary | ICD-10-CM

## 2023-08-11 NOTE — Progress Notes (Signed)
 Established Patient Office Visit  Subjective:  Patient ID: Angelica Wyatt, female    DOB: 01/09/1961  Age: 63 y.o. MRN: [id number]  Chief Complaint  Patient presents with   Follow-up    3 month follow up    Patient comes in for her follow-up today.  Her blood pressure is normal today.  She was feeling well until this morning, started noticing a scratchy throat.  But no fever or chills, no bodyaches, no headaches and no dizziness.  Thinks it is her allergies.  Patient advised to monitor if anything changes, then can check a home COVID test.  She is fasting for blood work today.  She has been getting vitamin B12 injections regularly. Her mammogram is due in April, will send in an order.    No other concerns at this time.   Past Medical History:  Diagnosis Date   Anemia    h/o   Bilateral tennis elbow    Carpal tunnel syndrome of left wrist    Fibromyalgia    Heart murmur    followed by PCP   Hypertension    during pregnancy   Left carpal tunnel syndrome 07/08/2015   Sleep apnea    resolved with wt loss   Thyroid  goiter    Thyroid  goiter     Past Surgical History:  Procedure Laterality Date   KATRINA OSTEOTOMY Left 03/23/2016   Procedure: KATRINA OSTEOTOMY;  Surgeon: Eva Gay, DPM;  Location: [facility];  Service: Podiatry;  Laterality: Left;   BREAST CYST ASPIRATION Left    neg   CARPAL TUNNEL RELEASE Right    CESAREAN SECTION     x2   CHEILECTOMY Left 03/23/2016   Procedure: LEFT GREAT TOE CHEILECTOMY;  Surgeon: Eva Gay, DPM;  Location: [facility];  Service: Podiatry;  Laterality: Left;  POPLITEAL  Latex senstivity   CHOLECYSTECTOMY  2008   during gastric bypass   GASTRIC BYPASS  2008   [facility]   LAPAROSCOPY      Social History   Socioeconomic History   Marital status: Divorced    Spouse name: Not on file   Number of children: Not on file   Years of education: Not on file   Highest education level: Not on file  Occupational  History   Not on file  Tobacco Use   Smoking status: Former    Current packs/day: 0.00    Types: Cigarettes    Quit date: 07/04/2002    Years since quitting: 21.1   Smokeless tobacco: Never  Substance and Sexual Activity   Alcohol use: Yes    Alcohol/week: 3.0 standard drinks of alcohol    Types: 3 Glasses of wine per week   Drug use: Not on file   Sexual activity: Not on file  Other Topics Concern   Not on file  Social History Narrative   Not on file   Social Drivers of Health   Financial Resource Strain: Not on file  Food Insecurity: Not on file  Transportation Needs: Not on file  Physical Activity: Not on file  Stress: Not on file  Social Connections: Not on file  Intimate Partner Violence: Not on file    Family History  Problem Relation Age of Onset   Breast cancer Maternal Aunt    Breast cancer Cousin        mat cousin   Breast cancer Cousin        female pat cousin    Allergies  Allergen  Reactions   Shrimp [Shellfish Allergy] Hives and Swelling    angioedema   Latex     condoms   Naproxen Hives    Outpatient Medications Prior to Visit  Medication Sig   albuterol  (VENTOLIN  HFA) 108 (90 Base) MCG/ACT inhaler Inhale 2 puffs into the lungs every 6 (six) hours as needed for wheezing or shortness of breath.   alendronate  (FOSAMAX ) 70 MG tablet Take 1 tablet (70 mg total) by mouth every 7 (seven) days. Take with a full glass of water on an empty stomach.   baclofen (LIORESAL) 10 MG tablet Take 10 mg by mouth daily.   busPIRone (BUSPAR) 10 MG tablet Take 10 mg by mouth 2 (two) times daily.   cyanocobalamin  (VITAMIN B12) 1000 MCG/ML injection Inject 1,000 mcg into the muscle.   DULoxetine (CYMBALTA) 60 MG capsule Take 60 mg by mouth daily.   ergocalciferol  (VITAMIN D2) 1.25 MG (50000 UT) capsule Take 1 capsule (50,000 Units total) by mouth once a week.   esomeprazole  (NEXIUM ) 40 MG capsule Take 1 capsule (40 mg total) by mouth daily at 12 noon.   fluticasone   (FLONASE ) 50 MCG/ACT nasal spray Place 1 spray into both nostrils daily.   levothyroxine  (SYNTHROID ) 175 MCG tablet TAKE 1 TABLET BY MOUTH DAILY  BEFORE BREAKFAST   meloxicam  (MOBIC ) 15 MG tablet Take 1 tablet (15 mg total) by mouth daily.   traMADol (ULTRAM) 50 MG tablet Take 50 mg by mouth every 8 (eight) hours as needed.   Cyanocobalamin  (VITAMIN B-12 IJ) Inject as directed every 7 (seven) days. (Patient not taking: Reported on 08/11/2023)   loratadine  (CLARITIN ) 10 MG tablet Take 1 tablet (10 mg total) by mouth daily. (Patient not taking: Reported on 08/11/2023)   No facility-administered medications prior to visit.    Review of Systems  Constitutional: Negative.  Negative for chills, diaphoresis, fever, malaise/fatigue and weight loss.  HENT: Negative.  Negative for congestion, ear pain and sinus pain.   Eyes: Negative.   Respiratory: Negative.  Negative for cough and shortness of breath.   Cardiovascular: Negative.  Negative for chest pain, palpitations and leg swelling.  Gastrointestinal: Negative.  Negative for abdominal pain, constipation, diarrhea, heartburn, nausea and vomiting.  Genitourinary: Negative.  Negative for dysuria and flank pain.  Musculoskeletal: Negative.  Negative for joint pain and myalgias.  Skin: Negative.   Neurological: Negative.  Negative for dizziness and headaches.  Endo/Heme/Allergies: Negative.   Psychiatric/Behavioral: Negative.  Negative for depression and suicidal ideas. The patient is not nervous/anxious.        Objective:   BP 120/78   Pulse 75   Ht 5' 6 (1.676 m)   Wt 175 lb 3.2 oz (79.5 kg)   SpO2 98%   BMI 28.28 kg/m   Vitals:   08/11/23 0917  BP: 120/78  Pulse: 75  Height: 5' 6 (1.676 m)  Weight: 175 lb 3.2 oz (79.5 kg)  SpO2: 98%  BMI (Calculated): 28.29    Physical Exam Vitals and nursing note reviewed.  Constitutional:      Appearance: Normal appearance.  HENT:     Head: Normocephalic and atraumatic.     Nose: Nose  normal.     Mouth/Throat:     Mouth: Mucous membranes are moist.     Pharynx: Oropharynx is clear.  Eyes:     Conjunctiva/sclera: Conjunctivae normal.     Pupils: Pupils are equal, round, and reactive to light.  Cardiovascular:     Rate and Rhythm: Normal rate and  regular rhythm.     Pulses: Normal pulses.     Heart sounds: Normal heart sounds. No murmur heard. Pulmonary:     Effort: Pulmonary effort is normal.     Breath sounds: Normal breath sounds. No wheezing.  Abdominal:     General: Bowel sounds are normal.     Palpations: Abdomen is soft.     Tenderness: There is no abdominal tenderness. There is no right CVA tenderness or left CVA tenderness.  Musculoskeletal:        General: Normal range of motion.     Cervical back: Normal range of motion.     Right lower leg: No edema.     Left lower leg: No edema.  Skin:    General: Skin is warm and dry.  Neurological:     General: No focal deficit present.     Mental Status: She is alert and oriented to person, place, and time.  Psychiatric:        Mood and Affect: Mood normal.        Behavior: Behavior normal.      No results found for any visits on 08/11/23.  No results found for this or any previous visit (from the past 2160 hours).    Assessment & Plan:  Check labs.  Continue meds.  Mammogram in April. Problem List Items Addressed This Visit     Hypothyroidism   Relevant Orders   TSH+T4F+T3Free   Vitamin B12 deficiency (non anemic)   Relevant Orders   Vitamin B12   GAD (generalized anxiety disorder)   Mixed hyperlipidemia - Primary   Other Visit Diagnoses       Breast cancer screening by mammogram       Relevant Orders   MM 3D SCREENING MAMMOGRAM BILATERAL BREAST     Essential hypertension, benign       Relevant Orders   CMP14+EGFR     Gastroesophageal reflux disease without esophagitis           Return in about 3 months (around 11/08/2023).   Total time spent: 30 minutes  FERNAND FREDY RAMAN,  MD  08/11/2023   This document may have been prepared by [facility] Voice Recognition software and as such may include unintentional dictation errors.

## 2023-08-12 LAB — CMP14+EGFR
ALT: 34 [IU]/L — ABNORMAL HIGH (ref 0–32)
AST: 51 [IU]/L — ABNORMAL HIGH (ref 0–40)
Albumin: 3.8 g/dL — ABNORMAL LOW (ref 3.9–4.9)
Alkaline Phosphatase: 47 [IU]/L (ref 44–121)
BUN/Creatinine Ratio: 9 — ABNORMAL LOW (ref 12–28)
BUN: 8 mg/dL (ref 8–27)
Bilirubin Total: 0.2 mg/dL (ref 0.0–1.2)
CO2: 25 mmol/L (ref 20–29)
Calcium: 8.9 mg/dL (ref 8.7–10.3)
Chloride: 104 mmol/L (ref 96–106)
Creatinine, Ser: 0.91 mg/dL (ref 0.57–1.00)
Globulin, Total: 1.9 g/dL (ref 1.5–4.5)
Glucose: 81 mg/dL (ref 70–99)
Potassium: 4.2 mmol/L (ref 3.5–5.2)
Sodium: 141 mmol/L (ref 134–144)
Total Protein: 5.7 g/dL — ABNORMAL LOW (ref 6.0–8.5)
eGFR: 71 mL/min/{1.73_m2} (ref >59)

## 2023-08-12 LAB — TSH+T4F+T3FREE
Free T4: 1.67 ng/dL (ref 0.82–1.77)
T3, Free: 2.6 pg/mL (ref 2.0–4.4)
TSH: 1.28 u[IU]/mL (ref 0.450–4.500)

## 2023-08-12 LAB — VITAMIN B12: Vitamin B-12: 376 pg/mL (ref 232–1245)

## 2023-08-14 ENCOUNTER — Telehealth: Payer: Self-pay | Admitting: Internal Medicine

## 2023-08-14 NOTE — Telephone Encounter (Signed)
 Patient left VM wanting to let Dr. Meredeth Wyatt know that she is positive for Covid. Just FYI.

## 2023-09-14 ENCOUNTER — Telehealth: Payer: Self-pay

## 2023-09-14 NOTE — Telephone Encounter (Signed)
 Patient LM asking for you to call her back with her lab results, states she's called multiple times but has not received a phone call back

## 2023-09-15 ENCOUNTER — Ambulatory Visit
Admission: EM | Admit: 2023-09-15 | Discharge: 2023-09-15 | Disposition: A | Discharge status: Home / Self Care | Attending: Emergency Medicine | Admitting: Emergency Medicine

## 2023-09-15 ENCOUNTER — Encounter: Payer: Self-pay | Admitting: Emergency Medicine

## 2023-09-15 DIAGNOSIS — Z1152 Encounter for screening for COVID-19: Secondary | ICD-10-CM | POA: Diagnosis not present

## 2023-09-15 DIAGNOSIS — B9789 Other viral agents as the cause of diseases classified elsewhere: Secondary | ICD-10-CM | POA: Insufficient documentation

## 2023-09-15 DIAGNOSIS — J028 Acute pharyngitis due to other specified organisms: Secondary | ICD-10-CM | POA: Diagnosis not present

## 2023-09-15 DIAGNOSIS — R5383 Other fatigue: Secondary | ICD-10-CM | POA: Diagnosis not present

## 2023-09-15 DIAGNOSIS — J029 Acute pharyngitis, unspecified: Secondary | ICD-10-CM | POA: Insufficient documentation

## 2023-09-15 LAB — RESP PANEL BY RT-PCR (FLU A&B, COVID) ARPGX2
Influenza A by PCR: NEGATIVE
Influenza B by PCR: NEGATIVE
SARS Coronavirus 2 by RT PCR: NEGATIVE

## 2023-09-15 LAB — GROUP A STREP BY PCR: Group A Strep by PCR: NOT DETECTED

## 2023-09-15 NOTE — ED Provider Notes (Signed)
 [facility]    CSN: [id number] Arrival date & time: 09/15/23  0836      History   Chief Complaint Chief Complaint  Patient presents with   Sore Throat    HPI Angelica Wyatt is a 63 y.o. female.   63 year old female, Angelica Wyatt, presents to urgent care for evaluation of sore throat and fatigue that started 4 days ago.  Patient denies any fevers.  Patient requesting COVID and flu testing as well as strep testing.  Patient is taking over-the-counter meds and drinking hot liquids for symptom management.  Past medical history: Fibromyalgia, goiter, sleep apnea, hypertension, anemia  The history is provided by the patient. No language interpreter was used.    Past Medical History:  Diagnosis Date   Anemia    h/o   Bilateral tennis elbow    Carpal tunnel syndrome of left wrist    Fibromyalgia    Heart murmur    followed by PCP   Hypertension    during pregnancy   Left carpal tunnel syndrome 07/08/2015   Sleep apnea    resolved with wt loss   Thyroid goiter    Thyroid goiter     Patient Active Problem List   Diagnosis Date Noted   Viral pharyngitis 09/15/2023   Mixed hyperlipidemia 03/30/2023   Seasonal allergic rhinitis due to pollen 02/14/2023   Mass of lower inner quadrant of right breast 09/27/2022   Hypothyroidism 09/27/2022   Vitamin B12 deficiency (non anemic) 09/27/2022   GAD (generalized anxiety disorder) 09/27/2022   Vitamin D deficiency 09/27/2022   CRPS (complex regional pain syndrome) type I of lower limb 08/29/2017   Myofascial pain 12/22/2016   Lateral epicondylitis 03/19/2015    Past Surgical History:  Procedure Laterality Date   AIKEN OSTEOTOMY Left 03/23/2016   Procedure: Quintella Reichert OSTEOTOMY;  Surgeon: Gwyneth Revels, DPM;  Location: [facility];  Service: Podiatry;  Laterality: Left;   BREAST CYST ASPIRATION Left    neg   CARPAL TUNNEL RELEASE Right    CESAREAN SECTION     x2   CHEILECTOMY Left 03/23/2016   Procedure: LEFT  GREAT TOE CHEILECTOMY;  Surgeon: Gwyneth Revels, DPM;  Location: [facility];  Service: Podiatry;  Laterality: Left;  POPLITEAL  Latex senstivity   CHOLECYSTECTOMY  2008   during gastric bypass   GASTRIC BYPASS  2008   [facility]   LAPAROSCOPY      OB History   No obstetric history on file.      Home Medications    Prior to Admission medications   Medication Sig Start Date End Date Taking? Authorizing Provider  alendronate (FOSAMAX) 70 MG tablet Take 1 tablet (70 mg total) by mouth every 7 (seven) days. Take with a full glass of water on an empty stomach. 02/14/23 02/14/24 Yes Margaretann Loveless, MD  baclofen (LIORESAL) 10 MG tablet Take 10 mg by mouth daily. 05/19/16  Yes [provider]  busPIRone (BUSPAR) 10 MG tablet Take 10 mg by mouth 2 (two) times daily.   Yes [provider]  Cyanocobalamin (VITAMIN B-12 IJ) Inject as directed every 7 (seven) days.   Yes [provider]  DULoxetine (CYMBALTA) 60 MG capsule Take 60 mg by mouth daily.   Yes [provider]  ergocalciferol (VITAMIN D2) 1.25 MG (50000 UT) capsule Take 1 capsule (50,000 Units total) by mouth once a week. 02/14/23  Yes Margaretann Loveless, MD  fluticasone (FLONASE) 50 MCG/ACT nasal spray Place 1 spray  into both nostrils daily. 02/14/23  Yes Margaretann Loveless, MD  meloxicam (MOBIC) 15 MG tablet Take 1 tablet (15 mg total) by mouth daily. 08/11/22  Yes Margaretann Loveless, MD  albuterol (VENTOLIN HFA) 108 (90 Base) MCG/ACT inhaler Inhale 2 puffs into the lungs every 6 (six) hours as needed for wheezing or shortness of breath. 03/30/23   Margaretann Loveless, MD  cyanocobalamin (VITAMIN B12) 1000 MCG/ML injection Inject 1,000 mcg into the muscle. 01/11/18   [provider]  esomeprazole (NEXIUM) 40 MG capsule Take 1 capsule (40 mg total) by mouth daily at 12 noon. 01/31/23   Miki Kins, FNP  levothyroxine (SYNTHROID) 175 MCG tablet TAKE 1 TABLET BY MOUTH DAILY  BEFORE BREAKFAST 04/17/23   Margaretann Loveless, MD  loratadine (CLARITIN) 10 MG tablet Take 1 tablet (10 mg total) by mouth daily. Patient not taking: Reported on 03/30/2023 02/14/23   Margaretann Loveless, MD  traMADol (ULTRAM) 50 MG tablet Take 50 mg by mouth every 8 (eight) hours as needed. 03/17/23   [provider]    Family History Family History  Problem Relation Age of Onset   Breast cancer Maternal Aunt    Breast cancer Cousin        mat cousin   Breast cancer Cousin        female pat cousin    Social History Social History   Tobacco Use   Smoking status: Former    Current packs/day: 0.00    Types: Cigarettes    Quit date: 07/04/2002    Years since quitting: 21.2   Smokeless tobacco: Never  Vaping Use   Vaping status: Never Used  Substance Use Topics   Alcohol use: Yes    Alcohol/week: 3.0 standard drinks of alcohol    Types: 3 Glasses of wine per week     Allergies   Shrimp [shellfish allergy], Latex, and Naproxen   Review of Systems Review of Systems  Constitutional:  Positive for fatigue. Negative for fever.  HENT:  Positive for congestion, sinus pressure and sore throat.   All other systems reviewed and are negative.    Physical Exam Triage Vital Signs ED Triage Vitals  Encounter Vitals Group     BP 09/15/23 0858 119/82     Systolic BP Percentile --      Diastolic BP Percentile --      Pulse Rate 09/15/23 0858 (!) 57     Resp 09/15/23 0858 14     Temp 09/15/23 0858 98 F (36.7 C)     Temp Source 09/15/23 0858 Oral     SpO2 --      Weight 09/15/23 0854 175 lb 4.3 oz (79.5 kg)     Height 09/15/23 0854 5\' 6"  (1.676 m)     Head Circumference --      Peak Flow --      Pain Score 09/15/23 0853 8     Pain Loc --      Pain Education --      Exclude from Growth Chart --    No data found.  Updated Vital Signs BP 119/82 (BP Location: Left Arm)   Pulse (!) 57   Temp 98 F (36.7 C) (Oral)   Resp 14   Ht 5\' 6"  (1.676 m)   Wt 175 lb 4.3 oz (79.5 kg)   SpO2 100%   BMI 28.29 kg/m    Visual Acuity Right Eye Distance:   Left Eye Distance:   Bilateral  Distance:    Right Eye Near:   Left Eye Near:    Bilateral Near:     Physical Exam Vitals and nursing note reviewed.  Constitutional:      General: She is not in acute distress.    Appearance: She is well-developed and well-groomed.  HENT:     Head: Normocephalic.     Right Ear: Tympanic membrane is retracted.     Left Ear: Tympanic membrane is retracted.     Nose: Mucosal edema and congestion present.     Right Sinus: No maxillary sinus tenderness or frontal sinus tenderness.     Left Sinus: No maxillary sinus tenderness or frontal sinus tenderness.     Mouth/Throat:     Lips: Pink.     Mouth: Mucous membranes are moist.     Pharynx: Oropharynx is clear. Uvula midline.  Eyes:     General: Lids are normal.     Conjunctiva/sclera: Conjunctivae normal.     Pupils: Pupils are equal, round, and reactive to light.  Neck:     Trachea: No tracheal deviation.  Cardiovascular:     Rate and Rhythm: Normal rate and regular rhythm.     Heart sounds: Normal heart sounds. No murmur heard. Pulmonary:     Effort: Pulmonary effort is normal.     Breath sounds: Normal breath sounds and air entry.  Abdominal:     General: Bowel sounds are normal.     Palpations: Abdomen is soft.     Tenderness: There is no abdominal tenderness.  Musculoskeletal:        General: Normal range of motion.     Cervical back: Normal range of motion.  Lymphadenopathy:     Cervical: No cervical adenopathy.  Skin:    General: Skin is warm and dry.     Findings: No rash.  Neurological:     General: No focal deficit present.     Mental Status: She is alert and oriented to person, place, and time.     GCS: GCS eye subscore is 4. GCS verbal subscore is 5. GCS motor subscore is 6.  Psychiatric:        Speech: Speech normal.        Behavior: Behavior normal. Behavior is cooperative.      UC Treatments / Results  Labs (all labs ordered  are listed, but only abnormal results are displayed) Labs Reviewed  GROUP A STREP BY PCR  RESP PANEL BY RT-PCR (FLU A&B, COVID) [id number]    EKG   Radiology No results found.  Procedures Procedures (including critical care time)  Medications Ordered in UC Medications - No data to display  Initial Impression / Assessment and Plan / UC Course  I have reviewed the triage vital signs and the nursing notes.  Pertinent labs & imaging results that were available during my care of the patient were reviewed by me and considered in my medical decision making (see chart for details).  Clinical Course as of 09/15/23 1034  Fri Sep 15, 2023  0921 Pt requesting covid/flu test for work, will check my chart for results. [JD]    Clinical Course User Index [JD] Amory Simonetti, Para March, NP   Discussed exam findings and plan of care with patient, will check my chart for results of flu/covid testing, strep negative, strict go to ER precautions given.   Patient verbalized understanding to this provider.  Ddx: Viral pharyngitis, seasonal allergies Final Clinical Impressions(s) / UC Diagnoses   Final diagnoses:  Viral  pharyngitis     Discharge Instructions      Strep is negative. Check my chart for covid,flu results. Most likely you have a viral illness: no antibiotic is indicated at this time, May treat with OTC meds of choice(flonase,chloraseptic throat lozenges, etc). Make sure to drink plenty of fluids to stay hydrated(gatorade, water, popsicles,jello,etc), avoid caffeine products. Follow up with PCP. Return as needed.     ED Prescriptions   None    PDMP not reviewed this encounter.   Clancy Gourd, NP 09/15/23 1034

## 2023-09-15 NOTE — Discharge Instructions (Addendum)
 Strep is negative. Check my chart for covid,flu results. Most likely you have a viral illness: no antibiotic is indicated at this time, May treat with OTC meds of choice(flonase,chloraseptic throat lozenges, etc). Make sure to drink plenty of fluids to stay hydrated(gatorade, water, popsicles,jello,etc), avoid caffeine products. Follow up with PCP. Return as needed.

## 2023-09-15 NOTE — ED Triage Notes (Signed)
 Patient c/o sore throat and fatigue that started 4 days ago.  Patient denies fevesr.

## 2023-09-18 ENCOUNTER — Ambulatory Visit (INDEPENDENT_AMBULATORY_CARE_PROVIDER_SITE_OTHER): Admitting: Internal Medicine

## 2023-09-18 ENCOUNTER — Encounter: Payer: Self-pay | Admitting: Internal Medicine

## 2023-09-18 DIAGNOSIS — E538 Deficiency of other specified B group vitamins: Secondary | ICD-10-CM

## 2023-09-18 MED ORDER — CYANOCOBALAMIN 1000 MCG/ML IJ SOLN
1000.0000 ug | Freq: Once | INTRAMUSCULAR | Status: AC
  Administered 2023-09-18: 1000 ug via INTRAMUSCULAR

## 2023-09-18 NOTE — Progress Notes (Signed)
 Established Patient Office Visit  Subjective:  Patient ID: Angelica Wyatt, female    DOB: 10-26-1960  Age: 63 y.o. MRN: [id number]  No chief complaint on file.   Patient her for Vit b12 inj.    No other concerns at this time.   Past Medical History:  Diagnosis Date   Anemia    h/o   Bilateral tennis elbow    Carpal tunnel syndrome of left wrist    Fibromyalgia    Heart murmur    followed by PCP   Hypertension    during pregnancy   Left carpal tunnel syndrome 07/08/2015   Sleep apnea    resolved with wt loss   Thyroid goiter    Thyroid goiter     Past Surgical History:  Procedure Laterality Date   Quintella Reichert OSTEOTOMY Left 03/23/2016   Procedure: Quintella Reichert OSTEOTOMY;  Surgeon: Gwyneth Revels, DPM;  Location: [facility];  Service: Podiatry;  Laterality: Left;   BREAST CYST ASPIRATION Left    neg   CARPAL TUNNEL RELEASE Right    CESAREAN SECTION     x2   CHEILECTOMY Left 03/23/2016   Procedure: LEFT GREAT TOE CHEILECTOMY;  Surgeon: Gwyneth Revels, DPM;  Location: [facility];  Service: Podiatry;  Laterality: Left;  POPLITEAL  Latex senstivity   CHOLECYSTECTOMY  2008   during gastric bypass   GASTRIC BYPASS  2008   [facility]   LAPAROSCOPY      Social History   Socioeconomic History   Marital status: Divorced    Spouse name: Not on file   Number of children: Not on file   Years of education: Not on file   Highest education level: Not on file  Occupational History   Not on file  Tobacco Use   Smoking status: Former    Current packs/day: 0.00    Types: Cigarettes    Quit date: 07/04/2002    Years since quitting: 21.2   Smokeless tobacco: Never  Vaping Use   Vaping status: Never Used  Substance and Sexual Activity   Alcohol use: Yes    Alcohol/week: 3.0 standard drinks of alcohol    Types: 3 Glasses of wine per week   Drug use: Not on file   Sexual activity: Not on file  Other Topics Concern   Not on file  Social History Narrative   Not on  file   Social Drivers of Health   Financial Resource Strain: Not on file  Food Insecurity: Not on file  Transportation Needs: Not on file  Physical Activity: Not on file  Stress: Not on file  Social Connections: Not on file  Intimate Partner Violence: Not on file    Family History  Problem Relation Age of Onset   Breast cancer Maternal Aunt    Breast cancer Cousin        mat cousin   Breast cancer Cousin        female pat cousin    Allergies  Allergen Reactions   Shrimp [Shellfish Allergy] Hives and Swelling    angioedema   Latex     condoms   Naproxen Hives    Outpatient Medications Prior to Visit  Medication Sig   albuterol (VENTOLIN HFA) 108 (90 Base) MCG/ACT inhaler Inhale 2 puffs into the lungs every 6 (six) hours as needed for wheezing or shortness of breath.   alendronate (FOSAMAX) 70 MG tablet Take 1 tablet (70 mg total) by mouth every 7 (seven) days. Take with a  full glass of water on an empty stomach.   baclofen (LIORESAL) 10 MG tablet Take 10 mg by mouth daily.   busPIRone (BUSPAR) 10 MG tablet Take 10 mg by mouth 2 (two) times daily.   Cyanocobalamin (VITAMIN B-12 IJ) Inject as directed every 7 (seven) days.   cyanocobalamin (VITAMIN B12) 1000 MCG/ML injection Inject 1,000 mcg into the muscle.   DULoxetine (CYMBALTA) 60 MG capsule Take 60 mg by mouth daily.   ergocalciferol (VITAMIN D2) 1.25 MG (50000 UT) capsule Take 1 capsule (50,000 Units total) by mouth once a week.   esomeprazole (NEXIUM) 40 MG capsule Take 1 capsule (40 mg total) by mouth daily at 12 noon.   fluticasone (FLONASE) 50 MCG/ACT nasal spray Place 1 spray into both nostrils daily.   levothyroxine (SYNTHROID) 175 MCG tablet TAKE 1 TABLET BY MOUTH DAILY  BEFORE BREAKFAST   loratadine (CLARITIN) 10 MG tablet Take 1 tablet (10 mg total) by mouth daily. (Patient not taking: Reported on 03/30/2023)   meloxicam (MOBIC) 15 MG tablet Take 1 tablet (15 mg total) by mouth daily.   traMADol (ULTRAM) 50 MG  tablet Take 50 mg by mouth every 8 (eight) hours as needed.   No facility-administered medications prior to visit.    ROS     Objective:   There were no vitals taken for this visit.  There were no vitals filed for this visit.  Physical Exam   No results found for any visits on 09/18/23.  Recent Results (from the past 2160 hours)  CMP14+EGFR     Status: Abnormal   Collection Time: 08/11/23 10:03 AM  Result Value Ref Range   Glucose 81 70 - 99 mg/dL   BUN 8 8 - 27 mg/dL   Creatinine, Ser 7.82 0.57 - 1.00 mg/dL   eGFR 71 >95 AO/ZHY/8.65   BUN/Creatinine Ratio 9 (L) 12 - 28   Sodium 141 134 - 144 mmol/L   Potassium 4.2 3.5 - 5.2 mmol/L   Chloride 104 96 - 106 mmol/L   CO2 25 20 - 29 mmol/L   Calcium 8.9 8.7 - 10.3 mg/dL   Total Protein 5.7 (L) 6.0 - 8.5 g/dL   Albumin 3.8 (L) 3.9 - 4.9 g/dL   Globulin, Total 1.9 1.5 - 4.5 g/dL   Bilirubin Total 0.2 0.0 - 1.2 mg/dL   Alkaline Phosphatase 47 44 - 121 IU/L   AST 51 (H) 0 - 40 IU/L   ALT 34 (H) 0 - 32 IU/L  Vitamin B12     Status: None   Collection Time: 08/11/23 10:03 AM  Result Value Ref Range   Vitamin B-12 376 232 - 1,245 pg/mL  TSH+T4F+T3Free     Status: None   Collection Time: 08/11/23 10:03 AM  Result Value Ref Range   TSH 1.280 0.450 - 4.500 uIU/mL   T3, Free 2.6 2.0 - 4.4 pg/mL   Free T4 1.67 0.82 - 1.77 ng/dL  Group A Strep by PCR     Status: None   Collection Time: 09/15/23  8:58 AM   Specimen: Throat; Sterile Swab  Result Value Ref Range   Group A Strep by PCR NOT DETECTED NOT DETECTED    Comment: Performed at [facility], [address] [place], [place] [address]  Resp Panel by RT-PCR (Flu A&B, Covid) Anterior Nasal Swab     Status: None   Collection Time: 09/15/23  9:23 AM   Specimen: Anterior Nasal Swab  Result Value Ref Range   SARS  Coronavirus 2 by RT PCR NEGATIVE NEGATIVE    Comment: (NOTE) SARS-CoV-2 target nucleic acids are NOT DETECTED.  The SARS-CoV-2 RNA is generally  detectable in upper respiratory specimens during the acute phase of infection. The lowest concentration of SARS-CoV-2 viral copies this assay can detect is 138 copies/mL. A negative result does not preclude SARS-Cov-2 infection and should not be used as the sole basis for treatment or other patient management decisions. A negative result may occur with  improper specimen collection/handling, submission of specimen other than nasopharyngeal swab, presence of viral mutation(s) within the areas targeted by this assay, and inadequate number of viral copies(<138 copies/mL). A negative result must be combined with clinical observations, patient history, and epidemiological information. The expected result is Negative.  Fact Sheet for Patients:  BloggerCourse.com  Fact Sheet for Healthcare Providers:  SeriousBroker.it  This test is no t yet approved or cleared by the [place] FDA and  has been authorized for detection and/or diagnosis of SARS-CoV-2 by FDA under an Emergency Use Authorization (EUA). This EUA will remain  in effect (meaning this test can be used) for the duration of the COVID-19 declaration under Section 564(b)(1) of the Act, 21 U.S.C.section 360bbb-3(b)(1), unless the authorization is terminated  or revoked sooner.       Influenza A by PCR NEGATIVE NEGATIVE   Influenza B by PCR NEGATIVE NEGATIVE    Comment: (NOTE) The Xpert Xpress SARS-CoV-2/FLU/RSV plus assay is intended as an aid in the diagnosis of influenza from Nasopharyngeal swab specimens and should not be used as a sole basis for treatment. Nasal washings and aspirates are unacceptable for Xpert Xpress SARS-CoV-2/FLU/RSV testing.  Fact Sheet for Patients: BloggerCourse.com  Fact Sheet for Healthcare Providers: SeriousBroker.it  This test is not yet approved or cleared by the [place] FDA and has  been authorized for detection and/or diagnosis of SARS-CoV-2 by FDA under an Emergency Use Authorization (EUA). This EUA will remain in effect (meaning this test can be used) for the duration of the COVID-19 declaration under Section 564(b)(1) of the Act, 21 U.S.C. section 360bbb-3(b)(1), unless the authorization is terminated or revoked.  Performed at [facility], [address] [place], [place] [address]       Assessment & Plan:   Problem List Items Addressed This Visit     Vitamin B12 deficiency (non anemic) - Primary    No follow-ups on file.   Total time spent: 10 minutes  Margaretann Loveless, MD  09/18/2023   This document may have been prepared by [facility] Voice Recognition software and as such may include unintentional dictation errors.

## 2023-09-25 ENCOUNTER — Ambulatory Visit (INDEPENDENT_AMBULATORY_CARE_PROVIDER_SITE_OTHER): Admitting: Internal Medicine

## 2023-09-25 ENCOUNTER — Encounter: Payer: Self-pay | Admitting: Internal Medicine

## 2023-09-25 DIAGNOSIS — E538 Deficiency of other specified B group vitamins: Secondary | ICD-10-CM

## 2023-09-25 MED ORDER — CYANOCOBALAMIN 1000 MCG/ML IJ SOLN
1000.0000 ug | Freq: Once | INTRAMUSCULAR | Status: AC
  Administered 2023-09-25: 1000 ug via INTRAMUSCULAR

## 2023-09-25 NOTE — Progress Notes (Signed)
 Vit B 12 inj.

## 2023-10-02 ENCOUNTER — Encounter: Payer: Self-pay | Admitting: Internal Medicine

## 2023-10-02 ENCOUNTER — Ambulatory Visit (INDEPENDENT_AMBULATORY_CARE_PROVIDER_SITE_OTHER): Admitting: Internal Medicine

## 2023-10-02 DIAGNOSIS — E538 Deficiency of other specified B group vitamins: Secondary | ICD-10-CM

## 2023-10-02 MED ORDER — CYANOCOBALAMIN 1000 MCG/ML IJ SOLN
1000.0000 ug | Freq: Once | INTRAMUSCULAR | Status: AC
  Administered 2023-10-02: 1000 ug via INTRAMUSCULAR

## 2023-10-02 NOTE — Progress Notes (Signed)
Vitamin B12 injection only

## 2023-10-04 DIAGNOSIS — G90529 Complex regional pain syndrome I of unspecified lower limb: Secondary | ICD-10-CM | POA: Diagnosis not present

## 2023-10-09 ENCOUNTER — Ambulatory Visit

## 2023-10-10 ENCOUNTER — Ambulatory Visit (INDEPENDENT_AMBULATORY_CARE_PROVIDER_SITE_OTHER): Admitting: Internal Medicine

## 2023-10-10 ENCOUNTER — Telehealth: Payer: Self-pay | Admitting: Internal Medicine

## 2023-10-10 ENCOUNTER — Encounter: Payer: Self-pay | Admitting: Internal Medicine

## 2023-10-10 DIAGNOSIS — E538 Deficiency of other specified B group vitamins: Secondary | ICD-10-CM

## 2023-10-10 MED ORDER — CYANOCOBALAMIN 1000 MCG/ML IJ SOLN
1000.0000 ug | Freq: Once | INTRAMUSCULAR | Status: AC
Start: 2023-10-10 — End: 2023-10-10
  Administered 2023-10-10: 1000 ug via INTRAMUSCULAR

## 2023-10-10 NOTE — Telephone Encounter (Signed)
 Patient called in saying that her hair is falling out and her nails are very thin to the point of splitting. She is concerned about her Vit D levels being low. She wants to know if she can take 2 of the weekly Vit D instead of just one. She sometimes takes a Vit D 1000 units supplement but not every day. Please advise.

## 2023-10-10 NOTE — Progress Notes (Signed)
 Vit B12 inj only-

## 2023-10-16 ENCOUNTER — Ambulatory Visit

## 2023-10-19 ENCOUNTER — Ambulatory Visit (INDEPENDENT_AMBULATORY_CARE_PROVIDER_SITE_OTHER): Admitting: Internal Medicine

## 2023-10-19 ENCOUNTER — Encounter: Payer: Self-pay | Admitting: Internal Medicine

## 2023-10-19 DIAGNOSIS — E538 Deficiency of other specified B group vitamins: Secondary | ICD-10-CM | POA: Diagnosis not present

## 2023-10-19 MED ORDER — CYANOCOBALAMIN 1000 MCG/ML IJ SOLN
1000.0000 ug | Freq: Once | INTRAMUSCULAR | Status: AC
  Administered 2023-10-19: 1000 ug via INTRAMUSCULAR

## 2023-10-19 NOTE — Progress Notes (Signed)
Vit B12 injection

## 2023-11-06 ENCOUNTER — Encounter: Payer: Self-pay | Admitting: Internal Medicine

## 2023-11-06 ENCOUNTER — Ambulatory Visit (INDEPENDENT_AMBULATORY_CARE_PROVIDER_SITE_OTHER): Admitting: Internal Medicine

## 2023-11-06 DIAGNOSIS — E538 Deficiency of other specified B group vitamins: Secondary | ICD-10-CM | POA: Diagnosis not present

## 2023-11-06 MED ORDER — CYANOCOBALAMIN 1000 MCG/ML IJ SOLN
1000.0000 ug | Freq: Once | INTRAMUSCULAR | Status: AC
  Administered 2023-11-06: 1000 ug via INTRAMUSCULAR

## 2023-11-06 NOTE — Progress Notes (Signed)
 Vit B12 inj today

## 2023-11-14 NOTE — Progress Notes (Unsigned)
B12 Injection given.

## 2023-11-16 ENCOUNTER — Ambulatory Visit: Payer: Medicare Other | Admitting: Internal Medicine

## 2024-02-23 ENCOUNTER — Other Ambulatory Visit: Payer: Self-pay | Admitting: Internal Medicine

## 2024-02-23 ENCOUNTER — Other Ambulatory Visit: Payer: Self-pay

## 2024-02-23 DIAGNOSIS — J301 Allergic rhinitis due to pollen: Secondary | ICD-10-CM

## 2024-02-23 MED ORDER — ALBUTEROL SULFATE HFA 108 (90 BASE) MCG/ACT IN AERS: 2.0000 | INHALATION_SPRAY | Freq: Four times a day (QID) | RESPIRATORY_TRACT | 2 refills | Status: DC | PRN

## 2024-03-25 NOTE — Progress Notes (Signed)
 Confidential Telehealth Psychology Note [facility]    Patient Name: Angelica Wyatt Medical Record Number: [id number] Date of Service: March 25, 2024 Attending Psychologist: Charletta Johnathan Blind, PhD  CPT Procedure Code: 09165 for 45 minutes of audio/visual based counseling  Therapy Type: Behavior Modifying/Acceptance and Commitment Therapy (ACT) Purpose of Treatment: reduce depression symptoms, reduce anxiety symptoms, improve quality of life, improve chronic pain coping   Subjective:  This visit was performed face to face with interactive technology using a HIPPA compliant audio/visual platform. We reviewed confidentiality today. The patient was present in [place] , a state in which this provider is licensed and able to provide care (location and contact information confirmed), attended this visit alone, and consented to this virtual pain psychology visit.  Ms.  Wyatt is a pleasant 63 y.o.  female with chronic pain and anxiety. She is being seen via telehealth for this visit. Pt described her mood as ok and initially presented with slightly guarded. Patient described feeling overwhelmed with current stressors (finances, health, alcohol use, interpersonal struggles). Explored continued barriers to exercise and again stressed the benefits of movement given patient feeling overwhelmed.  Extra time was spent discussing patient's pattern of stating she will set aside an hour for our appointments and repeatedly not being fully available for the full hour.   Objective / Mental Status Exam: Speech/Language:    Normal rate, volume, tone, fluency  Mood:   Depressed and Anxious  Thought process:   Slightly tangential  Thought content:    Denies SI, HI, self harm, delusions, obsessions, paranoid ideation, or ideas of reference  Perceptual disturbances:    Denies auditory and visual hallucinations, behavior not concerning for response to internal stimuli   Orientation:  Oriented to person, place, time, and general circumstances  Attention:   Able to fully attend without fluctuations in consciousness  Concentration:   Able to fully concentrate and attend  Memory:   Immediate, short-term, long-term, and recall grossly intact   Fund of knowledge:    Consistent with level of education and development  Insight:     Fair  Judgment:    Intact  Impulse Control:   Intact    Assessment: Ms.  Wyatt was both engaged in session and at times appeared to be cautious and vague when describing different situations.    Diagnostic Impression:  Generalized Anxiety Disorder    Plan: Pt will continue to work on values-guided behavior in the context of interpersonal difficulties.   Angelica Wyatt will follow up in approximately one month for audio/visual based counseling.  Charletta Johnathan Blind, PhD Clinical Pain Psychologist

## 2024-04-07 ENCOUNTER — Other Ambulatory Visit: Payer: Self-pay | Admitting: Internal Medicine

## 2024-04-07 DIAGNOSIS — J301 Allergic rhinitis due to pollen: Secondary | ICD-10-CM

## 2024-04-09 ENCOUNTER — Encounter: Payer: Self-pay | Admitting: Internal Medicine

## 2024-04-09 ENCOUNTER — Ambulatory Visit (INDEPENDENT_AMBULATORY_CARE_PROVIDER_SITE_OTHER): Admitting: Internal Medicine

## 2024-04-09 VITALS — BP 110/62 | HR 80 | Ht 66.0 in | Wt 178.0 lb

## 2024-04-09 DIAGNOSIS — E538 Deficiency of other specified B group vitamins: Secondary | ICD-10-CM | POA: Diagnosis not present

## 2024-04-09 DIAGNOSIS — Z1389 Encounter for screening for other disorder: Secondary | ICD-10-CM | POA: Insufficient documentation

## 2024-04-09 DIAGNOSIS — E039 Hypothyroidism, unspecified: Secondary | ICD-10-CM

## 2024-04-09 DIAGNOSIS — E042 Nontoxic multinodular goiter: Secondary | ICD-10-CM | POA: Diagnosis not present

## 2024-04-09 DIAGNOSIS — E782 Mixed hyperlipidemia: Secondary | ICD-10-CM

## 2024-04-09 DIAGNOSIS — Z0001 Encounter for general adult medical examination with abnormal findings: Secondary | ICD-10-CM | POA: Diagnosis not present

## 2024-04-09 DIAGNOSIS — Z23 Encounter for immunization: Secondary | ICD-10-CM | POA: Diagnosis not present

## 2024-04-09 DIAGNOSIS — F321 Major depressive disorder, single episode, moderate: Secondary | ICD-10-CM | POA: Insufficient documentation

## 2024-04-09 DIAGNOSIS — I10 Essential (primary) hypertension: Secondary | ICD-10-CM | POA: Insufficient documentation

## 2024-04-09 DIAGNOSIS — E559 Vitamin D deficiency, unspecified: Secondary | ICD-10-CM

## 2024-04-09 DIAGNOSIS — F1090 Alcohol use, unspecified, uncomplicated: Secondary | ICD-10-CM | POA: Insufficient documentation

## 2024-04-09 DIAGNOSIS — F411 Generalized anxiety disorder: Secondary | ICD-10-CM

## 2024-04-09 LAB — POCT URINALYSIS DIPSTICK
Bilirubin, UA: NEGATIVE
Blood, UA: NEGATIVE
Glucose, UA: NEGATIVE
Ketones, UA: NEGATIVE
Leukocytes, UA: NEGATIVE
Nitrite, UA: NEGATIVE
Protein, UA: NEGATIVE
Spec Grav, UA: 1.025 (ref 1.010–1.025)
Urobilinogen, UA: 0.2 U/dL
pH, UA: 6 (ref 5.0–8.0)

## 2024-04-09 MED ORDER — CYANOCOBALAMIN 1000 MCG/ML IJ SOLN
1000.0000 ug | Freq: Once | INTRAMUSCULAR | Status: AC
  Administered 2024-04-09: 1000 ug via INTRAMUSCULAR

## 2024-04-09 NOTE — Progress Notes (Signed)
 Established Patient Office Visit  Subjective:  Patient ID: Angelica Wyatt, female    DOB: [birth year]  Age: 63 y.o. MRN: [id number]  Chief Complaint  Patient presents with   Annual Exam    CPE    Patient is here today to follow up. She reports she has been struggling with depression since her last visit. She sees a psychiatrist and therapist frequently for pain management and to help with her mental health. They also manage her Tramadol. She states she has been trying to cut back on her alcohol intake as her last labs from February showed elevated liver enzymes. She states she is now drinking 1-2 glasses a wine a night to help with stress reduction after work. Reinforced need to stop drinking alcohol and avoid tylenol  use. Patient is due for routine fasting blood work. Will collect today.  Patient has COPD and reports intermittent wheezing and productive cough. Denies fever, chills or shortness of breath, recent illness. Encouraged patient to use her albuterol  inhaler as needed. Will give flu shot today at patients request.  Routine UA was checked today that was normal. Colonoscopy is scheduled for 04/22/24. She is past due for mammogram as of 10/2023. Referral was sent 08/2023. Patient will call to schedule mammogram. Last pap smear in 2023 was negative; will repeat next year.  Patient has known B12 deficiency was has gotten monthly injections in the past but reports switching to oral supplementation to reduce frequency of visits for injections. Will add B12 today with lab work and give patient B12 shot during visit.  Patient has known thyroid  nodules last US  in 2023; will collect repeat US  to monitor nodules as right side of thyroid  is enlarged nontender with palpation.    No other concerns at this time.   Past Medical History:  Diagnosis Date   Anemia    h/o   Bilateral tennis elbow    Carpal tunnel syndrome of left wrist    Fibromyalgia    Heart murmur    followed by PCP    Hypertension    during pregnancy   Left carpal tunnel syndrome 07/08/2015   Sleep apnea    resolved with wt loss   Thyroid  goiter    Thyroid  goiter     Past Surgical History:  Procedure Laterality Date   KATRINA OSTEOTOMY Left 03/23/2016   Procedure: KATRINA OSTEOTOMY;  Surgeon: Eva Gay, DPM;  Location: [facility];  Service: Podiatry;  Laterality: Left;   BREAST CYST ASPIRATION Left    neg   CARPAL TUNNEL RELEASE Right    CESAREAN SECTION     x2   CHEILECTOMY Left 03/23/2016   Procedure: LEFT GREAT TOE CHEILECTOMY;  Surgeon: Eva Gay, DPM;  Location: [facility];  Service: Podiatry;  Laterality: Left;  POPLITEAL  Latex senstivity   CHOLECYSTECTOMY  2008   during gastric bypass   GASTRIC BYPASS  2008   [facility]   LAPAROSCOPY      Social History   Socioeconomic History   Marital status: Divorced    Spouse name: Not on file   Number of children: Not on file   Years of education: Not on file   Highest education level: Not on file  Occupational History   Not on file  Tobacco Use   Smoking status: Former    Current packs/day: 0.00    Types: Cigarettes    Quit date: 07/04/2002    Years since quitting: 21.7   Smokeless tobacco: Never  Vaping  Use   Vaping status: Never Used  Substance and Sexual Activity   Alcohol use: Yes    Alcohol/week: 3.0 standard drinks of alcohol    Types: 3 Glasses of wine per week   Drug use: Not on file   Sexual activity: Not on file  Other Topics Concern   Not on file  Social History Narrative   Not on file   Social Drivers of Health   Financial Resource Strain: Not on file  Food Insecurity: Not on file  Transportation Needs: Not on file  Physical Activity: Not on file  Stress: Not on file  Social Connections: Not on file  Intimate Partner Violence: Not on file    Family History  Problem Relation Age of Onset   Breast cancer Maternal Aunt    Breast cancer Cousin        mat cousin   Breast cancer Cousin         female pat cousin    Allergies  Allergen Reactions   Shrimp [Shellfish Allergy] Hives and Swelling    angioedema   Latex     condoms   Naproxen Hives    Outpatient Medications Prior to Visit  Medication Sig   albuterol  (VENTOLIN  HFA) 108 (90 Base) MCG/ACT inhaler INHALE 2 PUFFS INTO THE LUNGS  EVERY 6 (SIX) HOURS AS NEEDED  FOR WHEEZING OR SHORTNESS OF  BREATH.   baclofen (LIORESAL) 10 MG tablet Take 10 mg by mouth daily.   busPIRone (BUSPAR) 10 MG tablet Take 10 mg by mouth 2 (two) times daily.   Cyanocobalamin  (VITAMIN B-12 IJ) Inject as directed every 7 (seven) days.   cyanocobalamin  (VITAMIN B12) 1000 MCG/ML injection Inject 1,000 mcg into the muscle.   DULoxetine (CYMBALTA) 60 MG capsule Take 60 mg by mouth daily.   ergocalciferol  (VITAMIN D2) 1.25 MG (50000 UT) capsule Take 1 capsule (50,000 Units total) by mouth once a week.   esomeprazole  (NEXIUM ) 40 MG capsule Take 1 capsule (40 mg total) by mouth daily at 12 noon.   fluticasone  (FLONASE ) 50 MCG/ACT nasal spray USE 1 SPRAY IN BOTH NOSTRILS  DAILY   levothyroxine  (SYNTHROID ) 175 MCG tablet TAKE 1 TABLET BY MOUTH DAILY  BEFORE BREAKFAST   loratadine  (CLARITIN ) 10 MG tablet Take 1 tablet (10 mg total) by mouth daily. (Patient not taking: Reported on 03/30/2023)   meloxicam  (MOBIC ) 15 MG tablet Take 1 tablet (15 mg total) by mouth daily.   traMADol (ULTRAM) 50 MG tablet Take 50 mg by mouth every 8 (eight) hours as needed.   No facility-administered medications prior to visit.    Review of Systems  Constitutional:  Positive for malaise/fatigue. Negative for chills and fever.  HENT: Negative.  Negative for congestion and sore throat.   Eyes: Negative.  Negative for blurred vision and pain.  Respiratory: Negative.  Negative for cough and shortness of breath.   Cardiovascular:  Positive for leg swelling (left foot that has had previous injury). Negative for chest pain and palpitations.  Gastrointestinal: Negative.  Negative  for abdominal pain, blood in stool, constipation, diarrhea, heartburn, melena, nausea and vomiting.  Genitourinary: Negative.  Negative for dysuria, flank pain, frequency and urgency.  Musculoskeletal: Negative.  Negative for joint pain and myalgias.  Skin: Negative.   Neurological: Negative.  Negative for dizziness, tingling, sensory change, weakness and headaches.  Endo/Heme/Allergies: Negative.   Psychiatric/Behavioral:  Positive for depression. Negative for suicidal ideas. The patient is not nervous/anxious.        Objective:  BP 110/62   Pulse 80   Ht 5' 6 (1.676 m)   Wt 178 lb (80.7 kg)   PF 98 L/min   BMI 28.73 kg/m   Vitals:   04/09/24 0953  BP: 110/62  Pulse: 80  Height: 5' 6 (1.676 m)  Weight: 178 lb (80.7 kg)  PF: 98 L/min  BMI (Calculated): 28.74    Physical Exam Vitals and nursing note reviewed.  Constitutional:      Appearance: Normal appearance.  HENT:     Head: Normocephalic and atraumatic.     Nose: Nose normal.     Mouth/Throat:     Mouth: Mucous membranes are moist.     Pharynx: Oropharynx is clear.  Eyes:     Conjunctiva/sclera: Conjunctivae normal.     Pupils: Pupils are equal, round, and reactive to light.  Neck:     Thyroid : Thyromegaly (right side) present.  Cardiovascular:     Rate and Rhythm: Normal rate and regular rhythm.     Pulses: Normal pulses.     Heart sounds: Normal heart sounds. No murmur heard. Pulmonary:     Effort: Pulmonary effort is normal.     Breath sounds: Normal breath sounds. No wheezing.  Abdominal:     General: Bowel sounds are normal.     Palpations: Abdomen is soft.     Tenderness: There is no abdominal tenderness. There is no right CVA tenderness or left CVA tenderness.  Musculoskeletal:        General: Normal range of motion.     Cervical back: Normal range of motion.     Right lower leg: No edema.     Left lower leg: Edema (+1) present.  Lymphadenopathy:     Cervical: Cervical adenopathy present.   Skin:    General: Skin is warm and dry.  Neurological:     General: No focal deficit present.     Mental Status: She is alert and oriented to person, place, and time.  Psychiatric:        Mood and Affect: Mood is depressed.        Behavior: Behavior normal.      Results for orders placed or performed in visit on 04/09/24  POCT Urinalysis Dipstick (81002)  Result Value Ref Range   Color, UA     Clarity, UA     Glucose, UA Negative Negative   Bilirubin, UA Negative    Ketones, UA Negative    Spec Grav, UA 1.025 1.010 - 1.025   Blood, UA Negative    pH, UA 6.0 5.0 - 8.0   Protein, UA Negative Negative   Urobilinogen, UA 0.2 0.2 or 1.0 E.U./dL   Nitrite, UA Negative    Leukocytes, UA Negative Negative   Appearance     Odor      Recent Results (from the past 2160 hours)  POCT Urinalysis Dipstick (18997)     Status: Normal   Collection Time: 04/09/24 10:05 AM  Result Value Ref Range   Color, UA     Clarity, UA     Glucose, UA Negative Negative   Bilirubin, UA Negative    Ketones, UA Negative    Spec Grav, UA 1.025 1.010 - 1.025   Blood, UA Negative    pH, UA 6.0 5.0 - 8.0   Protein, UA Negative Negative   Urobilinogen, UA 0.2 0.2 or 1.0 E.U./dL   Nitrite, UA Negative    Leukocytes, UA Negative Negative   Appearance  Odor        Assessment & Plan:  Continue medications as prescribed. Will check routine fasting blood work today and FU with patient on results. Flu shot given. Phone number for mammogram given. Thyroid  US  order to FU on previous nodules. B12 shot given. Problem List Items Addressed This Visit     Hypothyroidism   Relevant Orders   TSH+T4F+T3Free   US  THYROID    B12 deficiency   Relevant Orders   Vitamin B12   Vitamin B12   GAD (generalized anxiety disorder)   Vitamin D  deficiency   Relevant Orders   Vitamin D  (25 hydroxy)   Mixed hyperlipidemia   Relevant Orders   Lipid Panel w/o Chol/HDL Ratio   Screening for blood or protein in urine    Relevant Orders   POCT Urinalysis Dipstick (18997) (Completed)   Essential hypertension, benign - Primary   Relevant Orders   CMP14+EGFR   Alcohol use, unspecified, uncomplicated   Relevant Orders   Vitamin B1   Multiple thyroid  nodules   Relevant Orders   US  THYROID    Needs flu shot   Relevant Orders   Influenza, MDCK, trivalent, PF(Flucelvax egg-free) (Completed)   Current moderate episode of major depressive disorder (HCC)    Return in about 3 months (around 07/10/2024).   Total time spent: 30 minutes  FERNAND FREDY RAMAN, MD  04/09/2024   This document may have been prepared by [facility] Voice Recognition software and as such may include unintentional dictation errors.

## 2024-04-13 LAB — TSH+T4F+T3FREE
Free T4: 1.8 ng/dL — ABNORMAL HIGH (ref 0.82–1.77)
T3, Free: 2.5 pg/mL (ref 2.0–4.4)
TSH: 2.2 u[IU]/mL (ref 0.450–4.500)

## 2024-04-13 LAB — CMP14+EGFR
ALT: 21 IU/L (ref 0–32)
AST: 29 IU/L (ref 0–40)
Albumin: 4.1 g/dL (ref 3.9–4.9)
Alkaline Phosphatase: 48 IU/L — ABNORMAL LOW (ref 49–135)
BUN/Creatinine Ratio: 15 (ref 12–28)
BUN: 13 mg/dL (ref 8–27)
Bilirubin Total: 0.4 mg/dL (ref 0.0–1.2)
CO2: 26 mmol/L (ref 20–29)
Calcium: 9.2 mg/dL (ref 8.7–10.3)
Chloride: 103 mmol/L (ref 96–106)
Creatinine, Ser: 0.85 mg/dL (ref 0.57–1.00)
Globulin, Total: 2.4 g/dL (ref 1.5–4.5)
Glucose: 93 mg/dL (ref 70–99)
Potassium: 4.5 mmol/L (ref 3.5–5.2)
Sodium: 141 mmol/L (ref 134–144)
Total Protein: 6.5 g/dL (ref 6.0–8.5)
eGFR: 77 mL/min/1.73 (ref >59)

## 2024-04-13 LAB — LIPID PANEL W/O CHOL/HDL RATIO
Cholesterol, Total: 176 mg/dL (ref 100–199)
HDL: 82 mg/dL (ref >39)
LDL Chol Calc (NIH): 81 mg/dL (ref 0–99)
Triglycerides: 71 mg/dL (ref 0–149)
VLDL Cholesterol Cal: 13 mg/dL (ref 5–40)

## 2024-04-13 LAB — VITAMIN B12: Vitamin B-12: 423 pg/mL (ref 232–1245)

## 2024-04-13 LAB — VITAMIN B1: Thiamine: 99.4 nmol/L (ref 66.5–200.0)

## 2024-04-13 LAB — VITAMIN D 25 HYDROXY (VIT D DEFICIENCY, FRACTURES): Vit D, 25-Hydroxy: 34.4 ng/mL (ref 30.0–100.0)

## 2024-04-15 ENCOUNTER — Ambulatory Visit: Payer: Self-pay | Admitting: Internal Medicine

## 2024-04-15 NOTE — Progress Notes (Signed)
 Patient notified

## 2024-04-17 ENCOUNTER — Ambulatory Visit
Admission: RE | Admit: 2024-04-17 | Discharge: 2024-04-17 | Disposition: A | Discharge status: Home / Self Care | Source: Ambulatory Visit | Attending: Internal Medicine | Admitting: Internal Medicine

## 2024-04-17 DIAGNOSIS — E041 Nontoxic single thyroid nodule: Secondary | ICD-10-CM | POA: Diagnosis not present

## 2024-04-17 DIAGNOSIS — E039 Hypothyroidism, unspecified: Secondary | ICD-10-CM | POA: Insufficient documentation

## 2024-04-17 DIAGNOSIS — E042 Nontoxic multinodular goiter: Secondary | ICD-10-CM | POA: Insufficient documentation

## 2024-04-29 ENCOUNTER — Other Ambulatory Visit: Payer: Self-pay | Admitting: Internal Medicine

## 2024-04-29 DIAGNOSIS — G90529 Complex regional pain syndrome I of unspecified lower limb: Secondary | ICD-10-CM | POA: Diagnosis not present

## 2024-05-07 ENCOUNTER — Ambulatory Visit
Admission: RE | Admit: 2024-05-07 | Discharge: 2024-05-07 | Disposition: A | Discharge status: Home / Self Care | Source: Ambulatory Visit | Attending: Internal Medicine | Admitting: Internal Medicine

## 2024-05-07 DIAGNOSIS — Z1231 Encounter for screening mammogram for malignant neoplasm of breast: Secondary | ICD-10-CM | POA: Insufficient documentation

## 2024-05-15 ENCOUNTER — Ambulatory Visit

## 2024-06-20 ENCOUNTER — Ambulatory Visit: Admission: RE | Admit: 2024-06-20 | Source: Home / Self Care | Admitting: Gastroenterology

## 2024-06-20 SURGERY — COLONOSCOPY
Anesthesia: General

## 2024-07-11 ENCOUNTER — Ambulatory Visit: Admitting: Internal Medicine

## 2024-07-24 ENCOUNTER — Ambulatory Visit: Admitting: Anesthesiology

## 2024-07-24 ENCOUNTER — Other Ambulatory Visit: Payer: Self-pay

## 2024-07-24 ENCOUNTER — Encounter
Admission: RE | Disposition: A | Discharge status: Home / Self Care | Payer: Self-pay | Source: Home / Self Care | Attending: Internal Medicine

## 2024-07-24 ENCOUNTER — Ambulatory Visit
Admission: RE | Admit: 2024-07-24 | Discharge: 2024-07-24 | Disposition: A | Discharge status: Home / Self Care | Attending: Internal Medicine | Admitting: Internal Medicine

## 2024-07-24 ENCOUNTER — Encounter: Payer: Self-pay | Admitting: Internal Medicine

## 2024-07-24 DIAGNOSIS — F419 Anxiety disorder, unspecified: Secondary | ICD-10-CM | POA: Diagnosis not present

## 2024-07-24 DIAGNOSIS — D123 Benign neoplasm of transverse colon: Secondary | ICD-10-CM | POA: Insufficient documentation

## 2024-07-24 DIAGNOSIS — Z79899 Other long term (current) drug therapy: Secondary | ICD-10-CM | POA: Diagnosis not present

## 2024-07-24 DIAGNOSIS — K640 First degree hemorrhoids: Secondary | ICD-10-CM | POA: Insufficient documentation

## 2024-07-24 DIAGNOSIS — G4730 Sleep apnea, unspecified: Secondary | ICD-10-CM | POA: Diagnosis not present

## 2024-07-24 DIAGNOSIS — I10 Essential (primary) hypertension: Secondary | ICD-10-CM | POA: Diagnosis not present

## 2024-07-24 DIAGNOSIS — Z87891 Personal history of nicotine dependence: Secondary | ICD-10-CM | POA: Insufficient documentation

## 2024-07-24 DIAGNOSIS — E039 Hypothyroidism, unspecified: Secondary | ICD-10-CM | POA: Diagnosis not present

## 2024-07-24 DIAGNOSIS — M797 Fibromyalgia: Secondary | ICD-10-CM | POA: Insufficient documentation

## 2024-07-24 DIAGNOSIS — Z1211 Encounter for screening for malignant neoplasm of colon: Secondary | ICD-10-CM | POA: Diagnosis present

## 2024-07-24 HISTORY — PX: POLYPECTOMY: SHX149

## 2024-07-24 HISTORY — PX: COLONOSCOPY: SHX5424

## 2024-07-24 MED ORDER — EPHEDRINE SULFATE-NACL 50-0.9 MG/10ML-% IV SOSY
PREFILLED_SYRINGE | INTRAVENOUS | Status: DC | PRN
  Administered 2024-07-24: 15 mg via INTRAVENOUS
  Administered 2024-07-24: 10 mg via INTRAVENOUS

## 2024-07-24 MED ORDER — DEXMEDETOMIDINE HCL IN NACL 80 MCG/20ML IV SOLN
INTRAVENOUS | Status: DC | PRN
  Administered 2024-07-24: 12 ug via INTRAVENOUS
  Administered 2024-07-24: 8 ug via INTRAVENOUS

## 2024-07-24 MED ORDER — SODIUM CHLORIDE 0.9 % IV SOLN: INTRAVENOUS | Status: DC

## 2024-07-24 MED ORDER — LIDOCAINE HCL (PF) 2 % IJ SOLN
INTRAMUSCULAR | Status: AC
  Filled 2024-07-24: qty 5

## 2024-07-24 MED ORDER — PROPOFOL 10 MG/ML IV BOLUS
INTRAVENOUS | Status: DC | PRN
  Administered 2024-07-24 (×2): 50 mg via INTRAVENOUS

## 2024-07-24 MED ORDER — PROPOFOL 500 MG/50ML IV EMUL
INTRAVENOUS | Status: DC | PRN
  Administered 2024-07-24: 75 ug/kg/min via INTRAVENOUS

## 2024-07-24 MED ORDER — GLYCOPYRROLATE 0.2 MG/ML IJ SOLN
INTRAMUSCULAR | Status: DC | PRN
  Administered 2024-07-24: .2 mg via INTRAVENOUS

## 2024-07-24 MED ORDER — GLYCOPYRROLATE 0.2 MG/ML IJ SOLN
INTRAMUSCULAR | Status: AC
  Filled 2024-07-24: qty 1

## 2024-07-24 MED ORDER — LIDOCAINE HCL (CARDIAC) PF 100 MG/5ML IV SOSY
PREFILLED_SYRINGE | INTRAVENOUS | Status: DC | PRN
  Administered 2024-07-24: 60 mg via INTRAVENOUS

## 2024-07-24 NOTE — Anesthesia Postprocedure Evaluation (Signed)
"   Anesthesia Post Note  Patient: Angelica Wyatt  Procedure(s) Performed: COLONOSCOPY  Patient location during evaluation: PACU Anesthesia Type: General Level of consciousness: awake Pain management: satisfactory to patient Vital Signs Assessment: post-procedure vital signs reviewed and stable Respiratory status: spontaneous breathing Cardiovascular status: stable Anesthetic complications: no   No notable events documented.   Last Vitals:  Vitals:   07/24/24 1002 07/24/24 1003  BP:  110/70  Pulse:    Resp: 20 14  Temp:  (!) 35.7 C  SpO2:      Last Pain:  Vitals:   07/24/24 1003  TempSrc: Temporal  PainSc:                  VAN STAVEREN,Roma Bierlein      "

## 2024-07-24 NOTE — Interval H&P Note (Signed)
 History and Physical Interval Note:  07/24/2024 9:15 AM  Angelica Wyatt  has presented today for surgery, with the diagnosis of Personal history of colon polyps, unspecified (Z86.0100).  The various methods of treatment have been discussed with the patient and family. After consideration of risks, benefits and other options for treatment, the patient has consented to  Procedures: COLONOSCOPY (N/A) as a surgical intervention.  The patient's history has been reviewed, patient examined, no change in status, stable for surgery.  I have reviewed the patient's chart and labs.  Questions were answered to the patient's satisfaction.     [place], Idris Edmundson

## 2024-07-24 NOTE — Op Note (Signed)
 [facility] Gastroenterology Patient Name: Angelica Wyatt Procedure Date: 07/24/2024 9:10 AM MRN: [id number] Account #: [id number] Date of Birth: 02/09/1961 Admit Type: Outpatient Age: 64 Room: [facility] ENDO ROOM 2 Gender: Female Note Status: Finalized Instrument Name: Colon Scope [id number] Procedure:             Colonoscopy Indications:           High risk colon cancer surveillance: Personal history                         of colonic polyps Providers:             Johnmatthew Solorio K. Aundria MD, MD Referring MD:          Fredy CANDIE Bathe, MD (Referring MD) Medicines:             Propofol  per Anesthesia Complications:         No immediate complications. Estimated blood loss:                         Minimal. Procedure:             Pre-Anesthesia Assessment:                        - The risks and benefits of the procedure and the                         sedation options and risks were discussed with the                         patient. All questions were answered and informed                         consent was obtained.                        - Patient identification and proposed procedure were                         verified prior to the procedure by the nurse. The                         procedure was verified in the procedure room.                        - ASA Grade Assessment: III - A patient with severe                         systemic disease.                        - After reviewing the risks and benefits, the patient                         was deemed in satisfactory condition to undergo the                         procedure.                        After obtaining informed consent,  the colonoscope was                         passed under direct vision. Throughout the procedure,                         the patient's blood pressure, pulse, and oxygen                         saturations were monitored continuously. The                         Colonoscope was introduced through  the anus and                         advanced to the the cecum, identified by appendiceal                         orifice and ileocecal valve. The colonoscopy was                         performed without difficulty. The patient tolerated                         the procedure well. The quality of the bowel                         preparation was adequate. The ileocecal valve,                         appendiceal orifice, and rectum were photographed. Findings:      The perianal and digital rectal examinations were normal. Pertinent       negatives include normal sphincter tone and no palpable rectal lesions.      Non-bleeding internal hemorrhoids were found during retroflexion. The       hemorrhoids were Grade I (internal hemorrhoids that do not prolapse).      A 4 mm polyp was found in the transverse colon. The polyp was sessile.       The polyp was removed with a jumbo cold forceps. Resection and retrieval       were complete. Estimated blood loss was minimal.      The exam was otherwise without abnormality. Impression:            - Non-bleeding internal hemorrhoids.                        - One 4 mm polyp in the transverse colon, removed with                         a jumbo cold forceps. Resected and retrieved.                        - The examination was otherwise normal. Recommendation:        - Patient has a contact number available for                         emergencies. The signs and symptoms of potential  delayed complications were discussed with the patient.                         Return to normal activities tomorrow. Written                         discharge instructions were provided to the patient.                        - Resume previous diet.                        - Continue present medications.                        - Repeat colonoscopy is recommended for surveillance.                         The colonoscopy date will be determined after                          pathology results from today's exam become available                         for review.                        - Return to GI office PRN.                        - The findings and recommendations were discussed with                         the patient. Procedure Code(s):     --- Professional ---                        [phone], Colonoscopy, flexible; with biopsy, single or                         multiple Diagnosis Code(s):     --- Professional ---                        K64.0, First degree hemorrhoids                        D12.3, Benign neoplasm of transverse colon (hepatic                         flexure or splenic flexure)                        Z86.010, Personal history of colonic polyps CPT copyright 2022 [employer]. All rights reserved. The codes documented in this report are preliminary and upon coder review may  be revised to meet current compliance requirements. Ladell MARLA Boss MD, MD 07/24/2024 9:41:40 AM This report has been signed electronically. Number of Addenda: 0 Note Initiated On: 07/24/2024 9:10 AM Scope Withdrawal Time: 0 hours 6 minutes 4 seconds  Total Procedure Duration: 0 hours 11 minutes 26 seconds  Estimated Blood Loss:  Estimated blood loss was minimal.  [facility]

## 2024-07-24 NOTE — Anesthesia Preprocedure Evaluation (Signed)
 "                                  Anesthesia Evaluation  Patient identified by MRN, date of birth, ID band Patient awake    Reviewed: Allergy & Precautions, NPO status , Patient's Chart, lab work & pertinent test results  Airway Mallampati: II  TM Distance: >3 FB Neck ROM: Full    Dental  (+) Teeth Intact   Pulmonary neg pulmonary ROS, sleep apnea , Patient abstained from smoking., former smoker   Pulmonary exam normal        Cardiovascular Exercise Tolerance: Good hypertension, Pt. on medications negative cardio ROS Normal cardiovascular exam Rhythm:Regular Rate:Normal     Neuro/Psych   Anxiety     negative neurological ROS  negative psych ROS   GI/Hepatic negative GI ROS, Neg liver ROS,,,  Endo/Other  negative endocrine ROSHypothyroidism    Renal/GU negative Renal ROS  negative genitourinary   Musculoskeletal  (+)  Fibromyalgia -  Abdominal Normal abdominal exam  (+)   Peds negative pediatric ROS (+)  Hematology negative hematology ROS (+) Blood dyscrasia, anemia   Anesthesia Other Findings Past Medical History: No date: Anemia     Comment:  h/o No date: Bilateral tennis elbow No date: Carpal tunnel syndrome of left wrist No date: Fibromyalgia No date: Heart murmur     Comment:  followed by PCP No date: Hypertension     Comment:  during pregnancy 07/08/2015: Left carpal tunnel syndrome No date: Sleep apnea     Comment:  resolved with wt loss No date: Thyroid  goiter No date: Thyroid  goiter  Past Surgical History: 03/23/2016: KATRINA OSTEOTOMY; Left     Comment:  Procedure: KATRINA BOTTS;  Surgeon: Eva Gay,               DPM;  Location: [facility];  Service: Podiatry;               Laterality: Left; No date: BREAST CYST ASPIRATION; Left     Comment:  neg No date: CARPAL TUNNEL RELEASE; Right No date: CESAREAN SECTION     Comment:  x2 03/23/2016: CHEILECTOMY; Left     Comment:  Procedure: LEFT GREAT TOE CHEILECTOMY;   Surgeon: Eva Gay, DPM;  Location: [facility];  Service:               Podiatry;  Laterality: Left;  POPLITEAL  Latex               senstivity 2008: CHOLECYSTECTOMY     Comment:  during gastric bypass 2008: GASTRIC BYPASS     Comment:  [facility] No date: LAPAROSCOPY  BMI    Body Mass Index: 30.51 kg/m      Reproductive/Obstetrics negative OB ROS                              Anesthesia Physical Anesthesia Plan  ASA: 2  Anesthesia Plan: General   Post-op Pain Management:    Induction: Intravenous  PONV Risk Score and Plan: Propofol  infusion and TIVA  Airway Management Planned: Natural Airway and Nasal Cannula  Additional Equipment:   Intra-op Plan:   Post-operative Plan:   Informed Consent: I have reviewed the patients History and Physical, chart, labs and discussed the procedure including  the risks, benefits and alternatives for the proposed anesthesia with the patient or authorized representative who has indicated his/her understanding and acceptance.     Dental Advisory Given  Plan Discussed with: CRNA  Anesthesia Plan Comments:         Anesthesia Quick Evaluation  "

## 2024-07-24 NOTE — H&P (Signed)
 Outpatient short stay form Pre-procedure 07/24/2024 9:14 AM Gregoire Bennis K. Aundria, M.D.  Primary Physician: Fredy Bathe, M.D.  Reason for visit:  Personal history of colon polyps (Unspecified).  History of present illness:                            Patient presents for colonoscopy for a personal hx of colon polyps. The patient denies abdominal pain, abnormal weight loss or rectal bleeding.     Current Medications[1]  Medications Prior to Admission  Medication Sig Dispense Refill Last Dose/Taking   baclofen (LIORESAL) 10 MG tablet Take 10 mg by mouth daily.   Past Week   busPIRone (BUSPAR) 10 MG tablet Take 10 mg by mouth 2 (two) times daily.   Past Week   Cyanocobalamin  (VITAMIN B-12 IJ) Inject as directed every 7 (seven) days.   Past Week   cyanocobalamin  (VITAMIN B12) 1000 MCG/ML injection Inject 1,000 mcg into the muscle.   Past Week   DULoxetine (CYMBALTA) 60 MG capsule Take 60 mg by mouth daily.   Past Week   ergocalciferol  (VITAMIN D2) 1.25 MG (50000 UT) capsule Take 1 capsule (50,000 Units total) by mouth once a week. 12 capsule 3 Past Week   levothyroxine  (SYNTHROID ) 175 MCG tablet TAKE 1 TABLET BY MOUTH DAILY  BEFORE BREAKFAST 100 tablet 2 Past Week   traMADol (ULTRAM) 50 MG tablet Take 50 mg by mouth every 8 (eight) hours as needed.   07/23/2024   albuterol  (VENTOLIN  HFA) 108 (90 Base) MCG/ACT inhaler INHALE 2 PUFFS INTO THE LUNGS  EVERY 6 (SIX) HOURS AS NEEDED  FOR WHEEZING OR SHORTNESS OF  BREATH. 54 g 3    esomeprazole  (NEXIUM ) 40 MG capsule Take 1 capsule (40 mg total) by mouth daily at 12 noon. 90 capsule 1    fluticasone  (FLONASE ) 50 MCG/ACT nasal spray USE 1 SPRAY IN BOTH NOSTRILS  DAILY 32 g 3    loratadine  (CLARITIN ) 10 MG tablet Take 1 tablet (10 mg total) by mouth daily. (Patient not taking: Reported on 03/30/2023) 90 tablet 1    meloxicam  (MOBIC ) 15 MG tablet Take 1 tablet (15 mg total) by mouth daily. (Patient not taking: Reported on 07/24/2024) 90 tablet 1 Not Taking      Allergies[2]   Past Medical History:  Diagnosis Date   Anemia    h/o   Bilateral tennis elbow    Carpal tunnel syndrome of left wrist    Fibromyalgia    Heart murmur    followed by PCP   Hypertension    during pregnancy   Left carpal tunnel syndrome 07/08/2015   Sleep apnea    resolved with wt loss   Thyroid  goiter    Thyroid  goiter     Review of systems:  Otherwise negative.    Physical Exam  Gen: Alert, oriented. Appears stated age.  HEENT: [place]/AT. PERRLA. Lungs: CTA, no wheezes. CV: RR nl S1, S2. Abd: soft, benign, no masses. BS+ Ext: No edema. Pulses 2+    Planned procedures: Proceed with colonoscopy. The patient understands the nature of the planned procedure, indications, risks, alternatives and potential complications including but not limited to bleeding, infection, perforation, damage to internal organs and possible oversedation/side effects from anesthesia. The patient agrees and gives consent to proceed.  Please refer to procedure notes for findings, recommendations and patient disposition/instructions.     Irisa Grimsley K. Aundria, M.D. Gastroenterology 07/24/2024  9:14 AM          [  1]  Current Facility-Administered Medications:    0.9 %  sodium chloride  infusion, , Intravenous, Continuous, Atleigh Gruen K, MD [2]  Allergies Allergen Reactions   Shrimp [Shellfish Allergy] Hives and Swelling    angioedema   Latex     condoms   Naproxen Hives

## 2024-07-24 NOTE — Transfer of Care (Signed)
 Immediate Anesthesia Transfer of Care Note  Patient: Angelica Wyatt  Procedure(s) Performed: COLONOSCOPY  Patient Location: PACU  Anesthesia Type:General  Level of Consciousness: sedated  Airway & Oxygen Therapy: Patient Spontanous Breathing  Post-op Assessment: Report given to RN and Post -op Vital signs reviewed and stable  Post vital signs: Reviewed and stable  Last Vitals:  Vitals Value Taken Time  BP 112/70 07/24/24 09:44  Temp    Pulse 88 07/24/24 09:44  Resp 14 07/24/24 09:44  SpO2 99 % 07/24/24 09:44  Vitals shown include unfiled device data.  Last Pain:  Vitals:   07/24/24 0900  TempSrc: Temporal  PainSc: 0-No pain         Complications: No notable events documented.

## 2024-07-25 LAB — SURGICAL PATHOLOGY
